# Patient Record
Sex: Male | Born: 1970 | Hispanic: Yes | Marital: Single | Smoking: Current every day smoker
Health system: Southern US, Community
[De-identification: ages and names within clinical notes are randomized; demographics above are authoritative.]

## PROBLEM LIST (undated history)

## (undated) DIAGNOSIS — I2510 Atherosclerotic heart disease of native coronary artery without angina pectoris: Secondary | ICD-10-CM

## (undated) DIAGNOSIS — I10 Essential (primary) hypertension: Secondary | ICD-10-CM

## (undated) HISTORY — PX: OTHER SURGICAL HISTORY: SHX169

## (undated) HISTORY — PX: CORONARY ANGIOPLASTY: SHX604

---

## 2006-05-24 ENCOUNTER — Emergency Department: Payer: Self-pay | Admitting: Internal Medicine

## 2008-04-09 HISTORY — PX: CORONARY ANGIOPLASTY WITH STENT PLACEMENT: SHX49

## 2008-11-29 ENCOUNTER — Ambulatory Visit: Payer: Self-pay | Admitting: Cardiovascular Disease

## 2011-03-24 ENCOUNTER — Emergency Department: Payer: Self-pay | Admitting: Emergency Medicine

## 2011-09-10 ENCOUNTER — Emergency Department: Payer: Self-pay | Admitting: Emergency Medicine

## 2011-09-10 LAB — COMPREHENSIVE METABOLIC PANEL
Albumin: 3.5 g/dL (ref 3.4–5.0)
Alkaline Phosphatase: 78 U/L (ref 50–136)
Anion Gap: 12 (ref 7–16)
BUN: 17 mg/dL (ref 7–18)
Bilirubin,Total: 0.4 mg/dL (ref 0.2–1.0)
Calcium, Total: 8.7 mg/dL (ref 8.5–10.1)
Chloride: 102 mmol/L (ref 98–107)
Co2: 26 mmol/L (ref 21–32)
Creatinine: 0.85 mg/dL (ref 0.60–1.30)
EGFR (African American): >60
EGFR (Non-African Amer.): >60
Glucose: 68 mg/dL (ref 65–99)
Osmolality: 279 (ref 275–301)
Potassium: 4 mmol/L (ref 3.5–5.1)
SGOT(AST): 35 U/L (ref 15–37)
SGPT (ALT): 42 U/L
Sodium: 140 mmol/L (ref 136–145)
Total Protein: 7.8 g/dL (ref 6.4–8.2)

## 2011-09-10 LAB — CBC
HCT: 48.4 % (ref 40.0–52.0)
HGB: 16.2 g/dL (ref 13.0–18.0)
MCH: 32.9 pg (ref 26.0–34.0)
MCHC: 33.5 g/dL (ref 32.0–36.0)
MCV: 98 fL (ref 80–100)
Platelet: 158 10*3/uL (ref 150–440)
RBC: 4.93 10*6/uL (ref 4.40–5.90)
RDW: 14.5 % (ref 11.5–14.5)
WBC: 6.5 10*3/uL (ref 3.8–10.6)

## 2012-05-20 ENCOUNTER — Emergency Department: Payer: Self-pay | Admitting: Emergency Medicine

## 2012-10-07 ENCOUNTER — Emergency Department: Payer: Self-pay | Admitting: Emergency Medicine

## 2012-10-07 LAB — CBC
HCT: 46.4 % (ref 40.0–52.0)
HGB: 15.7 g/dL (ref 13.0–18.0)
MCH: 32.2 pg (ref 26.0–34.0)
MCHC: 34 g/dL (ref 32.0–36.0)
MCV: 95 fL (ref 80–100)
Platelet: 149 10*3/uL — ABNORMAL LOW (ref 150–440)
RBC: 4.89 10*6/uL (ref 4.40–5.90)
RDW: 14.1 % (ref 11.5–14.5)
WBC: 6.9 10*3/uL (ref 3.8–10.6)

## 2012-10-07 LAB — BASIC METABOLIC PANEL
Anion Gap: 4 — ABNORMAL LOW (ref 7–16)
BUN: 14 mg/dL (ref 7–18)
Calcium, Total: 8.8 mg/dL (ref 8.5–10.1)
Chloride: 109 mmol/L — ABNORMAL HIGH (ref 98–107)
Co2: 30 mmol/L (ref 21–32)
Creatinine: 0.9 mg/dL (ref 0.60–1.30)
EGFR (African American): >60
EGFR (Non-African Amer.): >60
Glucose: 97 mg/dL (ref 65–99)
Osmolality: 285 (ref 275–301)
Potassium: 4.4 mmol/L (ref 3.5–5.1)
Sodium: 143 mmol/L (ref 136–145)

## 2012-10-07 LAB — TROPONIN I: Troponin-I: <0.02 ng/mL

## 2013-05-04 ENCOUNTER — Emergency Department: Payer: Self-pay | Admitting: Emergency Medicine

## 2013-05-04 LAB — BASIC METABOLIC PANEL
BUN: 13 mg/dL (ref 7–18)
Calcium, Total: 8.3 mg/dL — ABNORMAL LOW (ref 8.5–10.1)
Chloride: 107 mmol/L (ref 98–107)
Co2: 29 mmol/L (ref 21–32)
Creatinine: 0.81 mg/dL (ref 0.60–1.30)
EGFR (African American): >60
EGFR (Non-African Amer.): >60
Glucose: 85 mg/dL (ref 65–99)
Osmolality: 269 (ref 275–301)
Potassium: 4.7 mmol/L (ref 3.5–5.1)
Sodium: 135 mmol/L — ABNORMAL LOW (ref 136–145)

## 2013-05-04 LAB — CBC
HCT: 49.1 % (ref 40.0–52.0)
HGB: 16.2 g/dL (ref 13.0–18.0)
MCH: 31.9 pg (ref 26.0–34.0)
MCHC: 33 g/dL (ref 32.0–36.0)
MCV: 97 fL (ref 80–100)
Platelet: 159 10*3/uL (ref 150–440)
RBC: 5.09 10*6/uL (ref 4.40–5.90)
RDW: 13.9 % (ref 11.5–14.5)
WBC: 5.7 10*3/uL (ref 3.8–10.6)

## 2013-05-04 LAB — TROPONIN I: Troponin-I: <0.02 ng/mL

## 2013-07-13 ENCOUNTER — Emergency Department: Payer: Self-pay | Admitting: Emergency Medicine

## 2013-10-23 ENCOUNTER — Emergency Department: Payer: Self-pay | Admitting: Emergency Medicine

## 2013-10-23 LAB — URINALYSIS, COMPLETE
Bacteria: NONE SEEN
Bilirubin,UR: NEGATIVE
Blood: NEGATIVE
Glucose,UR: NEGATIVE mg/dL (ref 0–75)
Ketone: NEGATIVE
Leukocyte Esterase: NEGATIVE
Nitrite: NEGATIVE
Ph: 5 (ref 4.5–8.0)
Protein: NEGATIVE
RBC,UR: 1 /HPF (ref 0–5)
Specific Gravity: 1.025 (ref 1.003–1.030)
Squamous Epithelial: 5
WBC UR: 5 /HPF (ref 0–5)

## 2014-08-24 ENCOUNTER — Encounter: Payer: Self-pay | Admitting: Emergency Medicine

## 2014-08-24 ENCOUNTER — Emergency Department
Admission: EM | Admit: 2014-08-24 | Discharge: 2014-08-24 | Disposition: A | Discharge status: Home / Self Care | Payer: BLUE CROSS/BLUE SHIELD | Attending: Emergency Medicine | Admitting: Emergency Medicine

## 2014-08-24 ENCOUNTER — Other Ambulatory Visit: Payer: Self-pay

## 2014-08-24 DIAGNOSIS — R51 Headache: Secondary | ICD-10-CM | POA: Diagnosis not present

## 2014-08-24 DIAGNOSIS — G8929 Other chronic pain: Secondary | ICD-10-CM | POA: Diagnosis not present

## 2014-08-24 DIAGNOSIS — Z720 Tobacco use: Secondary | ICD-10-CM | POA: Insufficient documentation

## 2014-08-24 DIAGNOSIS — R519 Headache, unspecified: Secondary | ICD-10-CM

## 2014-08-24 DIAGNOSIS — R079 Chest pain, unspecified: Secondary | ICD-10-CM | POA: Insufficient documentation

## 2014-08-24 LAB — BASIC METABOLIC PANEL
Anion gap: 6 (ref 5–15)
BUN: 16 mg/dL (ref 6–20)
CO2: 29 mmol/L (ref 22–32)
Calcium: 8.9 mg/dL (ref 8.9–10.3)
Chloride: 102 mmol/L (ref 101–111)
Creatinine, Ser: 0.79 mg/dL (ref 0.61–1.24)
GFR calc Af Amer: >60 mL/min (ref >60)
GFR calc non Af Amer: >60 mL/min (ref >60)
Glucose, Bld: 93 mg/dL (ref 65–99)
Potassium: 5.2 mmol/L — ABNORMAL HIGH (ref 3.5–5.1)
Sodium: 137 mmol/L (ref 135–145)

## 2014-08-24 LAB — CBC
HCT: 49.7 % (ref 40.0–52.0)
Hemoglobin: 16.7 g/dL (ref 13.0–18.0)
MCH: 32.1 pg (ref 26.0–34.0)
MCHC: 33.7 g/dL (ref 32.0–36.0)
MCV: 95.3 fL (ref 80.0–100.0)
Platelets: 147 10*3/uL — ABNORMAL LOW (ref 150–440)
RBC: 5.22 MIL/uL (ref 4.40–5.90)
RDW: 14.1 % (ref 11.5–14.5)
WBC: 5.5 10*3/uL (ref 3.8–10.6)

## 2014-08-24 LAB — TROPONIN I: Troponin I: <0.03 ng/mL (ref <0.031)

## 2014-08-24 MED ORDER — IBUPROFEN 800 MG PO TABS
800.0000 mg | ORAL_TABLET | Freq: Once | ORAL | Status: AC
  Administered 2014-08-24: 800 mg via ORAL

## 2014-08-24 MED ORDER — IBUPROFEN 800 MG PO TABS
ORAL_TABLET | ORAL | Status: AC
  Administered 2014-08-24: 800 mg via ORAL
  Filled 2014-08-24: qty 1

## 2014-08-24 NOTE — Discharge Instructions (Signed)
Dolor de pecho (no especfico) (Chest Pain (Nonspecific)) Suele ser difcil diagnosticar la causa del dolor de [place]. Siempre hay una posibilidad de que el dolor podra estar relacionado con algo grave, como un ataque al corazn o un cogulo sanguneo en los pulmones. Debe concurrir a las visitas de control con el mdico. CUIDADOS EN EL HOGAR  Si le dieron antibiticos, tmelos como se lo haya indicado el mdico. Finalice el medicamento, aunque comience a Actorsentirse mejor.  [address]Durante los das siguientes, no haga actividades que provoquen dolor de [place]. Contine con las actividades fsicas como se lo haya indicado el mdico.  No use productos que contengan tabaco, que incluyen cigarrillos, tabaco para Theatre managermascar y Administrator, Civil Servicecigarrillos electrnicos.  Evite el consumo de alcohol.  Tome los medicamentos solamente como se lo haya indicado el mdico.  Siga las sugerencias del mdico en lo que respecta a ms pruebas, si el dolor de pecho no desaparece.  Concurra a todas las visitas que concert con el mdico. SOLICITE AYUDA SI:  El dolor de pecho no desaparece, incluso despus del tratamiento.  Tiene una erupcin cutnea con ampollas en el pecho.  Tiene fiebre. SOLICITE AYUDA DE INMEDIATO SI:   Aumenta el dolor de pecho o el dolor se irradia hacia el brazo, el cuello, la [place]ula, la espalda o el vientre (abdomen).  Le falta el aire.  Tose ms de lo normal o tose con sangre.  Siente un dolor muy intenso en la espalda o el vientre.  Tiene malestar estomacal (nuseas) o vomita.  Se siente muy dbil.  Pierde el conocimiento (se desmaya).  Tiene escalofros. Esto es Radio broadcast assistantuna emergencia. No espere a ver que los problemas desaparezcan. Llame a los servicios de emergencia locales (911 en los [place]). No conduzca por sus propios medios Dollar Generalhasta el hospital. ASEGRESE DE QUE:   Comprende estas instrucciones.  Controlar su afeccin.  Recibir ayuda de inmediato si no mejora o si empeora. Document  Released: 06/22/2008 Document Revised: 03/31/2013 [facility] Patient Information 2015 [place], [place]. This information is not intended to replace advice given to you by your health care provider. Make sure you discuss any questions you have with your health care provider.  Dolor de cabeza general sin causa  (General Headache Without Cause)  Un dolor de cabeza en general es un dolor o malestar que se siente en la zona de la cabeza o del cuello. Se desconocen las causas.  CUIDADOS EN EL HOGAR   Cumpla con los controles mdicos segn las indicaciones.  Tome slo los medicamentos que le haya indicado el mdico.  Cuando sienta dolor de cabeza acustese en un cuarto oscuro y tranquilo.  Lleve un registro diario para averiguar si ciertas cosas provocan dolores de cabeza. Por ejemplo, escriba:  Lo que come y bebe.  Cunto tiempo duerme.  Todo cambio en la dieta o medicamentos.  Reljese recibiendo masajes o haga otras actividades relajantes.  Coloque hielo o calor en la cabeza y el cuello como lo indique su mdico.  DISMINUYA EL NIVEL DE ESTRS  Sintase con la espalda recta. No apriete los msculos (tensione).  Si fuma, deje de hacerlo.  Beba menos alcohol.  Consuma menos cafena o deje de tomarla.  Coma y duerma en horarios regulares.  Duerma entre 7 y 9 horas o como le indique su mdico.  DietitianMantenga las luces tenues si le Liz Claibornemolesta las luces brillantes o sus dolores de cabeza empeoran. SOLICITE AYUDA DE INMEDIATO SI:   El dolor de [place] empeora.  Tiene fiebre.  Presenta  rigidez en el cuello.  Tiene dificultad para ver.  Sus msculos estn dbiles o pierde el control muscular.  Pierde equilibrio o tiene problemas para Advertising account plannercaminar.  Siente que se desvanece (debilidad) o se desmaya.  Tiene sntomas intensos que son diferentes a los primeros sntomas.  Tiene problemas con los medicamentos que le recet su mdico.  El medicamento no le hace efecto.  Siente que el dolor de  [place] es diferente a otros dolores de [place]abe[place].  Tiene malestar estomacal (nuseas) o (vmitos). ASEGRESE DE QUE:   Comprende estas instrucciones.  Controlar su enfermedad.  Solicitar ayuda de inmediato si no mejora o si empeora. Document Released: 06/18/2011 [facility] Patient Information 2015 [place]Care, [place]LL[place]. This information is not intended to replace advice given to you by your health care provider. Make sure you discuss any questions you have with your health care provider.

## 2014-08-24 NOTE — ED Provider Notes (Signed)
[facility] Provider Note  ____________________________________________  Time seen: 23945  I have reviewed the triage vital signs and the nursing notes.   HISTORY  Chief Complaint Chest Pain  headache    HPI Walter Pierce is a 44 y.o. male who reports to me that he has had a headache since yesterday and it got worse this morning. He has a history of similar headaches. His primary focus in our discussion has been the headache as compared to the nurse's note recognizing chest pain. He does report he had some chest pain that started this morning. It is mild to moderate. He has no shortness of breath. He has no nausea vomiting or diaphoresis.   Patient denies any focal weakness or loss of sensation.   History reviewed. No pertinent past medical history.  There are no active problems to display for this patient.   Past Surgical History  Procedure Laterality Date  . Coronary angioplasty      Current Outpatient Rx  Name  Route  Sig  Dispense  Refill  . ibuprofen (ADVIL,MOTRIN) 200 MG tablet   Oral   Take 200 mg by mouth every 6 (six) hours as needed for headache or mild pain.           Allergies Review of patient's allergies indicates no known allergies.  Family History  Problem Relation Age of Onset  . Diabetes Mother   . Heart attack Father     Social History History  Substance Use Topics  . Smoking status: Current Every Day Smoker  . Smokeless tobacco: Not on file  . Alcohol Use: No    Review of Systems Constitutional: Negative for fever. ENT: Negative for sore throat. Cardiovascular: Positive for chest pain today. See history of present illness. Respiratory: Negative for shortness of breath. Gastrointestinal: Negative for abdominal pain, vomiting and diarrhea. Genitourinary: Negative for dysuria. Musculoskeletal: Negative for back pain. Skin: Negative for rash. Neurological: Positive for chronic  headaches, worse today. See history of present illness.   10-point ROS otherwise negative.  ____________________________________________   PHYSICAL EXAM:  VITAL SIGNS: ED Triage Vitals  Enc Vitals Group     BP 08/24/14 0813 140/81 mmHg     Pulse Rate 08/24/14 0813 80     Resp 08/24/14 0813 18     Temp 08/24/14 0813 98.5 F (36.9 C)     Temp Source 08/24/14 0813 Oral     SpO2 08/24/14 0813 99 %     Weight 08/24/14 0813 300 lb (136.079 kg)     Height 08/24/14 0813 5\' 7"  (1.702 m)     Head Cir --      Peak Flow --      Pain Score 08/24/14 0813 8     Pain Loc --      Pain Edu? --      Excl. in GC? --     Constitutional: Alert and oriented, communicative and is speaking male.. Well appearing and in no distress. ENT   Head: Normocephalic and atraumatic.   Nose: No congestion/rhinnorhea.   Mouth/Throat: Mucous membranes are moist. Cardiovascular: Normal rate, regular rhythm. Respiratory: Normal respiratory effort without tachypnea. Breath sounds are clear and equal bilaterally. No wheezes/rales/rhonchi. Gastrointestinal: Soft and nontender. No distention.  Back: There is no CVA tenderness. Musculoskeletal: Nontender with normal range of motion in all extremities.  No noted edema. Neurologic:  Normal speech and language. No gross focal neurologic deficits are appreciated.  Skin:  Skin is warm, dry.  No rash noted. Psychiatric: Mood and affect are normal. Speech and behavior are normal.  ____________________________________________    LABS (pertinent positives/negatives)  Blood tests overall are unremarkable except for slightly high potassium at 5.2. Troponin is negative.  ____________________________________________   EKG  EKG at 8:15 AM shows normal sinus rhythm at 74 with normal intervals. He has borderline criteria for left ventricular hypertrophy.  ____________________________________________     PROCEDURES  Procedure(s) performed: None  Critical  Care performed: No  ____________________________________________   INITIAL IMPRESSION / ASSESSMENT AND PLAN / ED COURSE  Well-appearing 44 year old male complaining of a slightly worse headache than usual. He does get headaches frequently. He has asked for ibuprofen. He did not take anything last night or this morning prior to arrival. He speaks of his chest pain but it appears to be secondary in his concerns. The discomfort is minimal. His EKG looks okay and his blood tests a chloride including a negative troponin.  We treated the patient with 800 mg of ibuprofen. On reassessment at 1245 he reports he feels much better. His daughter is here and helps with aspects of the history. An interpreter was used as well to speak with the patient. He is now pain-free and comfortable, negative neurologic exam, no chest pain, and ready for discharge. We [facility].  Of note we reviewed his cardiac catheterization from 2 or 3 years ago. Is required no stenting. Overall as an unremarkable catheter according to the patient and his daughter.  ____________________________________________   FINAL CLINICAL IMPRESSION(S) / ED DIAGNOSES  Final diagnoses:  Acute nonintractable headache, unspecified headache type  Nonspecific chest pain      Darien Ramusavid W Damarion Mendizabal, MD 08/24/14 1312

## 2014-08-24 NOTE — ED Notes (Signed)
Interpreter in room with patient, daughter and MD to discuss update on status.

## 2014-08-24 NOTE — ED Notes (Signed)
Pt to ed with c/o chest pain and headache that started this am while he was at work this am.  Pt states pain continues, reports it is in left chest and does not radiate to arms or neck.  Pt also reports sob and nausea associated with pain.

## 2014-12-06 ENCOUNTER — Ambulatory Visit (INDEPENDENT_AMBULATORY_CARE_PROVIDER_SITE_OTHER): Payer: BLUE CROSS/BLUE SHIELD | Admitting: Family Medicine

## 2014-12-06 ENCOUNTER — Encounter: Payer: Self-pay | Admitting: Family Medicine

## 2014-12-06 DIAGNOSIS — R030 Elevated blood-pressure reading, without diagnosis of hypertension: Secondary | ICD-10-CM

## 2014-12-06 DIAGNOSIS — R5382 Chronic fatigue, unspecified: Secondary | ICD-10-CM

## 2014-12-06 DIAGNOSIS — E669 Obesity, unspecified: Secondary | ICD-10-CM | POA: Insufficient documentation

## 2014-12-06 DIAGNOSIS — IMO0001 Reserved for inherently not codable concepts without codable children: Secondary | ICD-10-CM

## 2014-12-06 DIAGNOSIS — Z13220 Encounter for screening for lipoid disorders: Secondary | ICD-10-CM

## 2014-12-06 DIAGNOSIS — Z720 Tobacco use: Secondary | ICD-10-CM | POA: Diagnosis not present

## 2014-12-06 DIAGNOSIS — R609 Edema, unspecified: Secondary | ICD-10-CM

## 2014-12-06 DIAGNOSIS — Z833 Family history of diabetes mellitus: Secondary | ICD-10-CM

## 2014-12-06 DIAGNOSIS — M25552 Pain in left hip: Secondary | ICD-10-CM | POA: Diagnosis not present

## 2014-12-06 DIAGNOSIS — Z113 Encounter for screening for infections with a predominantly sexual mode of transmission: Secondary | ICD-10-CM | POA: Diagnosis not present

## 2014-12-06 LAB — UA/M W/RFLX CULTURE, ROUTINE
Bilirubin, UA: NEGATIVE
Glucose, UA: NEGATIVE
Ketones, UA: NEGATIVE
Leukocytes, UA: NEGATIVE
Nitrite, UA: NEGATIVE
Protein, UA: NEGATIVE
RBC, UA: NEGATIVE
Specific Gravity, UA: 1.015 (ref 1.005–1.030)
Urobilinogen, Ur: 1 mg/dL (ref 0.2–1.0)
pH, UA: 6.5 (ref 5.0–7.5)

## 2014-12-06 LAB — CBC WITH DIFFERENTIAL/PLATELET
Hematocrit: 49.3 % (ref 37.5–51.0)
Hemoglobin: 17.4 g/dL (ref 12.6–17.7)
Lymphocytes Absolute: 2.6 10*3/uL (ref 0.7–3.1)
Lymphs: 45 %
MCH: 33.5 pg — ABNORMAL HIGH (ref 26.6–33.0)
MCHC: 35.3 g/dL (ref 31.5–35.7)
MCV: 95 fL (ref 79–97)
MID (Absolute): 0.6 10*3/uL (ref 0.1–1.6)
MID: 10 %
Neutrophils Absolute: 2.5 10*3/uL (ref 1.4–7.0)
Neutrophils: 45 %
Platelets: 170 10*3/uL (ref 150–379)
RBC: 5.2 x10E6/uL (ref 4.14–5.80)
RDW: 14.8 % (ref 12.3–15.4)
WBC: 5.7 10*3/uL (ref 3.4–10.8)

## 2014-12-06 LAB — BAYER DCA HB A1C WAIVED: HB A1C (BAYER DCA - WAIVED): 5.3 % (ref <7.0)

## 2014-12-06 LAB — LIPID PANEL PICCOLO, WAIVED
Chol/HDL Ratio Piccolo,Waive: 4.2 mg/dL
Cholesterol Piccolo, Waived: 189 mg/dL (ref <200)
HDL Chol Piccolo, Waived: 45 mg/dL — ABNORMAL LOW (ref >59)
LDL Chol Calc Piccolo Waived: 107 mg/dL — ABNORMAL HIGH (ref <100)
Triglycerides Piccolo,Waived: 180 mg/dL — ABNORMAL HIGH (ref <150)
VLDL Chol Calc Piccolo,Waive: 36 mg/dL — ABNORMAL HIGH (ref <30)

## 2014-12-06 LAB — MICROALBUMIN, URINE WAIVED
Creatinine, Urine Waived: 200 mg/dL (ref 10–300)
Microalb, Ur Waived: 10 mg/L (ref 0–19)
Microalb/Creat Ratio: <30 mg/g (ref <30)

## 2014-12-06 NOTE — Assessment & Plan Note (Signed)
Labs checked today. Discussed the importance of diet and exercise- walking a day and decreasing portion size. Will work on it. Check in for PE in 2 weeks.

## 2014-12-06 NOTE — Progress Notes (Signed)
BP 130/88 mmHg  Pulse 79  Temp(Src) 98.6 F (37 C)  Ht 5' 6.3" (1.684 m)  Wt 303 lb (137.44 kg)  BMI 48.47 kg/m2  SpO2 98%   Subjective:    Patient ID: Walter Pierce, male    DOB: [birth year], 44 y.o.   MRN: [id number]  HPI: Walter Pierce is a 44 y.o. male  Here today to establish care and translation provided by a [facility] Interpretor. He has never seen a PCP. Chief Complaint  Patient presents with  . Diabetes    strong family history  . Migraine  . Hip Pain    left hip pain, it will give out on him when trying to rise from a sitting position, also while he is walking   He is concerned about potentially having DM. Has a very strong family history with his daughter having it, his grandparents needing amputations. He notes that he is overweight, and is concerned about having DM. Will check A1c and lab work today.  ?DIABETES Hypoglycemic episodes:yes Polydipsia/polyuria: yes Visual disturbance: yes- floaters and blurring Chest pain: no Paresthesias: yes  Has been going on about a year. Hurts more now than previously. Didn't hurt it. Has been getting worse over the last year, now giving out. Now giving pain down his left leg. Gives him numbness and tingling, had swelling last week- couldn't go to work. Worse with standing and walking, OK when sitting and resting. With working- has been around a 9/10. Feels tired in the leg. No stretching. No ice or heat  Relevant past medical, surgical, family and social history reviewed and updated as indicated. Interim medical history since our last visit reviewed. Allergies and medications reviewed and updated.  Review of Systems  Constitutional: Negative.   Respiratory: Negative.   Cardiovascular: Negative.   Gastrointestinal: Negative.   Genitourinary: Negative.   Psychiatric/Behavioral: Negative.    Per HPI unless specifically indicated above     Objective:    BP 130/88 mmHg  Pulse 79  Temp(Src) 98.6 F (37 C)  Ht  5' 6.3" (1.684 m)  Wt 303 lb (137.44 kg)  BMI 48.47 kg/m2  SpO2 98%  Wt Readings from Last 3 Encounters:  12/06/14 303 lb (137.44 kg)  08/24/14 300 lb (136.079 kg)    Physical Exam  Constitutional: He is oriented to person, place, and time. He appears well-developed and well-nourished. No distress.  Morbidly obese  HENT:  Head: Normocephalic and atraumatic.  Right Ear: Hearing normal.  Left Ear: Hearing normal.  Nose: Nose normal.  Eyes: Conjunctivae and lids are normal. Right eye exhibits no discharge. Left eye exhibits no discharge. No scleral icterus.  Cardiovascular: Normal rate, regular rhythm and normal heart sounds.  Exam reveals no gallop and no friction rub.   No murmur heard. Pulmonary/Chest: Effort normal and breath sounds normal. No respiratory distress. He has no wheezes. He has no rales. He exhibits no tenderness.  Musculoskeletal:       Left hip: He exhibits decreased range of motion, decreased strength and tenderness. He exhibits no bony tenderness, no swelling, no crepitus, no deformity and no laceration.  + FABREs on the L, no tenderness to palpation. Decreased flexion and extension on the L  Neurological: He is alert and oriented to person, place, and time.  Skin: Skin is warm, dry and intact. No rash noted. No erythema. No pallor.  Psychiatric: He has a normal mood and affect. His speech is normal and behavior is normal. Judgment and thought content normal.  Cognition and memory are normal.  Nursing note and vitals reviewed.  Results for orders placed or performed in visit on 12/06/14  Bayer DCA Hb A1c Waived  Result Value Ref Range   Bayer DCA Hb A1c Waived 5.3 <7.0 %  CBC With Differential/Platelet  Result Value Ref Range   WBC 5.7 3.4 - 10.8 x10E3/uL   RBC 5.20 4.14 - 5.80 x10E6/uL   Hemoglobin 17.4 12.6 - 17.7 g/dL   Hematocrit 16.1 09.6 - 51.0 %   MCV 95 79 - 97 fL   MCH 33.5 (H) 26.6 - 33.0 pg   MCHC 35.3 31.5 - 35.7 g/dL   RDW 04.5 40.9 - 81.1 %    Platelets 170 150 - 379 x10E3/uL   Neutrophils 45 %   Lymphs 45 %   MID 10 %   Neutrophils Absolute 2.5 1.4 - 7.0 x10E3/uL   Lymphocytes Absolute 2.6 0.7 - 3.1 x10E3/uL   MID (Absolute) 0.6 0.1 - 1.6 X10E3/uL  Lipid Panel Piccolo, Waived  Result Value Ref Range   Cholesterol Piccolo, Waived 189 <200 mg/dL   HDL Chol Piccolo, Waived 45 (L) >59 mg/dL   Triglycerides Piccolo,Waived 180 (H) <150 mg/dL   Chol/HDL Ratio Piccolo,Waive 4.2 mg/dL   LDL Chol Calc Piccolo Waived 107 (H) <100 mg/dL   VLDL Chol Calc Piccolo,Waive 36 (H) <30 mg/dL  Microalbumin, Urine Waived  Result Value Ref Range   Microalb, Ur Waived 10 0 - 19 mg/L   Creatinine, Urine Waived 200 10 - 300 mg/dL   Microalb/Creat Ratio <30 <30 mg/g  UA/M w/rflx Culture, Routine  Result Value Ref Range   Specific Gravity, UA 1.015 1.005 - 1.030   pH, UA 6.5 5.0 - 7.5   Color, UA Yellow Yellow   Appearance Ur Clear Clear   Leukocytes, UA Negative Negative   Protein, UA Negative Negative/Trace   Glucose, UA Negative Negative   Ketones, UA Negative Negative   RBC, UA Negative Negative   Bilirubin, UA Negative Negative   Urobilinogen, Ur 1.0 0.2 - 1.0 mg/dL   Nitrite, UA Negative Negative      Assessment & Plan:   Problem List Items Addressed This Visit      Other   Left hip pain    Seems to be within the joint itself. Will check x-ray to look for potential cause such as arthritis/labal issue. Would benefit from weight loss and possibly PT following x-ray. Ordered today. Await results.       Relevant Orders   DG Pelvis Comp Min 3V   Morbid obesity - Primary    Labs checked today. Discussed the importance of diet and exercise- walking a day and decreasing portion size. Will work on it. Check in for PE in 2 weeks.       Relevant Orders   Bayer DCA Hb A1c Waived (Completed)   Comprehensive metabolic panel   Microalbumin, Urine Waived (Completed)   TSH   Family history of diabetes mellitus (DM)    Symptoms  concerning. A1c checked today and was normal. Discussed the importance of weight loss and exercise. Information given to patient today. Work on diet and exercise.       Relevant Orders   Bayer DCA Hb A1c Waived (Completed)   Comprehensive metabolic panel   Microalbumin, Urine Waived (Completed)   TSH    Other Visit Diagnoses    Routine screening for STI (sexually transmitted infection)        Would like to be screened.  Labs drawn today.     Relevant Orders    HIV antibody    Hepatitis, Acute    GC/Chlamydia Probe Amp    RPR    HSV(herpes simplex vrs) 1+2 ab-IgG    Screening cholesterol level        Levels look good. Continue to monitor. Work on diet and exercise.     Relevant Orders    Lipid Panel Piccolo, Waived (Completed)    Chronic fatigue        Checking labs today. Await results. Likely due to weight.     Relevant Orders    CBC With Differential/Platelet (Completed)    TSH    Tobacco abuse        Encouraged stopping smoking.     Relevant Orders    CBC With Differential/Platelet (Completed)    UA/M w/rflx Culture, Routine (Completed)    Elevated BP        Better on recheck. Continue to monitor. Negative microalbumin today. Work on diet and exercise.      Relevant Orders    Microalbumin, Urine Waived (Completed)    Edema        Compression stockings sent to his pharmacy. 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     Relevant Orders    Compression stockings        Follow up plan: Return in about 2 weeks (around 12/20/2014), or 2-3 weeks PE.

## 2014-12-06 NOTE — Assessment & Plan Note (Signed)
Symptoms concerning. A1c checked today and was normal. Discussed the importance of weight loss and exercise. Information given to patient today. Work on diet and exercise.

## 2014-12-06 NOTE — Patient Instructions (Signed)
Ejercicios para perder peso (Exercise to Lose Weight) La actividad fsica y [place]omia Dear dieta saludable ayudan a perder peso. El mdico podr sugerirle ejercicios especficos. IDEAS Y CONSEJOS PARA HACER EJERCICIOS  Elija opciones econmicas que disfrute hacer , como caminar, andar en bicicleta o los vdeos para ejercitarse.  Utilice las Microbiologist del ascensor.  Camine durante la hora del almuerzo.  Estacione el auto lejos del lugar de [place] o [place].  Concurra a un gimnasio o tome clases de gimnasia.  Comience con 5  10 minutos de actividad fsica por da. Ejercite hasta 30 minutos, 4 a 6 das por [address]  Utilice zapatos que tengan un buen soporte y ropas cmodas.  Elongue antes y despus de Company secretary.  Ejercite hasta que aumente la respiracin y el corazn palpite rpido.  Beba agua extra cuando ejercite.  No haga ejercicio Firefighter, sentirse mareado o que le falte mucho el aire. La actividad fsica puede quemar alrededor de 150 caloras.  Correr 20 cuadras en 15 minutos.  Jugar vley durante 45 a 60 minutos.  Limpiar y encerar el auto durante 45 a 60 minutos.  Jugar ftbol americano de toque.  Caminar 25 cuadras en 35 minutos.  Empujar un cochecito 20 cuadras en 30 minutos.  Jugar baloncesto durante 30 minutos.  Rastrillar hojas secas durante 30 minutos.  Andar en bicicleta 80 cuadras en 30 minutos.  Caminar 30 cuadras en 30 minutos.  Bailar durante 30 minutos.  Quitar la nieve con una pala durante 15 minutos.  Nadar vigorosamente durante 20 minutos.  Subir escaleras durante 15 minutos.  Andar en bicicleta 60 cuadras durante 15 minutos.  Arreglar el jardn entre 30 y 45 minutos.  Saltar a la soga durante 15 minutos.  Limpiar vidrios o pisos durante 45 a 60 minutos. Document Released: 06/30/2010 Document Revised: 06/18/2011 [facility] Patient Information 2015 [place], [place]. This information is not intended to replace advice given to you  by your health care provider. Make sure you discuss any questions you have with your health care provider. Dieta para cuidar Insurance underwriter  (Cardiac Diet)  La dieta para cuidar el corazn evita las enfermedades cardacas o el ictus. Consiste en comer menos grasas no saludables y comer ms grasas saludables.  ALIMENTOS O BEBIDAS QUE DEBE EVITAR O LIMITAR   Limite el consumo de grasas saturadas. Este tipo de grasas se Occupational psychologist en los aceites y productos lcteos como:  Aceite de coco  Aceite de palma.  Manteca de cacao.  Manteca.  Evite las grasas trans o los aceites hidrogenados. Estas grasas se encuentran en los alimentos fritos o prehorneados como:  Occupational hygienist, tortas o galletas precocidas  Limite las carnes procesadas (hot dogs, fiambres, salchichas) a 3 onzas 885 gr.) por semana.  Limite las carnes con alto contenido de grasa (carnes veteadas, pollo frito o pollo con piel) a 3 onzas (85 gr.) por semana.  Limite la sal (sodio) a 1500 miligramos al da.  Limite los dulces y las bebidas con azcar agregada a no ms de 5 porciones por semana. Una porcin es:  1 cucharada de azcar.  1 cucharada de [place].   taza de sorbete.  1 taza de limonada.   taza de gaseosa comn. CONSUMA MS DE LOS SIGUIENTES ALIMENTOS  Frutas   Consuma 4 a 5 porciones al da. Una porcin de fruta equivale a:  1 fruta mediana entera.   taza de fruta seca.   taza de fruta fresca, congelada o enlatada.   taza  de fruta 100% jugo. Vegetales:   Consuma 4 o 5 porciones por Futures trader. Una porcin es:  1 taza de vegetales de hoja crudos.   taza de vegetales cortados, crudos o cocidos.   taza de jugo de naranjas Granos enteros   Consuma 3 porciones por da (1 onza [28 gr.] equivale a 1 porcin). Legumbres ( frijoles, arvejas y lentejas).   Consuma al menos 4 porciones por semana ( taza equivale a 1 porcin). Frutos secos y semillas   Consuma al menos 4 porciones a  la semana ( de taza equivale a 1 porcin). Fibra dietaria   Consuma 20 a 30 gramos por da. Algunos alimentos ricos en fibra son:  Hilda Lias secos.  Frutas ctricas.  Manzanas, bananas.  Brcoli, repollitos de [place]us[place] y berenjenas.  Avena. Grasas Omega-3   Consuma alimentos con grasas omega-3. Tambin puede tomar un comprimido dietario (suplemento) que contenga 1 gramo de DHA y [employer]. Consuma 3,5 onzas ( 100 gr.) de pescado graso por la semana, como:  Salmn.  Caballa.  Atn blanco.  Sardinas  Trucha de lago.  Arenque PREPARE SUS ALIMENTOS USTED MISMO   Cocine los alimentos hervidos, horneados, al vapor o asados. No cocine los alimentos en fritura. No cocine los alimentos en manteca (grasa).  Utilice un spray antiadherente para cocinar.  Quite la piel de las aves, como el pollo y [place].  Quite la grasa de la carne.  Retire la grasa de la superficie cuando cocine guisos, sopas y salsas.  Use limn o hierbas para condimentar comidas en lugar de usar mantequilla o margarina.  Use yogur descremados, salsas o aderezps para ensaladas con bajo contenido de [place]. Document Released: 09/25/2011 [facility] Patient Information 2015 [place], [place]. This information is not intended to replace advice given to you by your health care provider. Make sure you discuss any questions you have with your health care provider. Dolor de cadera (Hip Pain) La cadera es la articulacin entre la parte superior de las piernas y la parte inferior de la pelvis. Los [employer], Research scientist (physical sciences), los tendones y los msculos de la articulacin de la cadera trabajan arduamente cada da para sostener el peso del cuerpo y Nurse, children's. El Engineer, mining de cadera puede tener distintos grados, desde un dolor leve hasta un dolor intenso en uno o ambos lados de la cadera. El dolor puede sentirse en la parte interna de la articulacin de la cadera, cerca de la ingle, o en la parte externa, cerca de los glteos  y la parte superior de los muslos. Tambin puede estar acompaado de hinchazn o entumecimiento.  INSTRUCCIONES PARA EL CUIDADO EN EL HOGAR   Tome los medicamentos solamente como se lo haya indicado el mdico.  Aplique hielo sobre la zona lesionada.  Ponga el hielo en una bolsa plstica.  Coloque una toalla entre la piel y la bolsa de hielo.  Aplique el hielo de 3 a 4 veces por da, durante 15 a en cada aplicacin.  Mantenga la pierna levantada (elevada) siempre que sea posible, para reducir la hinchazn.  Evite las actividades que Teaching laboratory technician.  Siga los ejercicios especficos segn las indicaciones del mdico.  Duerma con una almohada entre las piernas del lado que le sea ms cmodo.  Anote la frecuencia con la que tiene dolor en la cadera, la ubicacin del dolor y lo que siente. SOLICITE ATENCIN MDICA SI:   No puede apoyar el peso del cuerpo [employer].  La cadera est enrojecida o hinchada, o  muy dolorida con la palpacin.  El dolor o la hinchazn continan o empeoran despus de 1semana.  Tiene una creciente dificultad para caminar.  Tiene fiebre. SOLICITE ATENCIN MDICA DE INMEDIATO SI:   Se ha cado.  El dolor y la hinchazn de la cadera aumentan de repente. ASEGRESE DE QUE:   Comprende estas instrucciones.  Controlar su afeccin.  Recibir ayuda de inmediato si no mejora o si empeora. Document Released: 08/10/2013 [facility] Patient Information 2015 [place], [place]. This information is not intended to replace advice given to you by your health care provider. Make sure you discuss any questions you have with your health care provider.

## 2014-12-06 NOTE — Assessment & Plan Note (Signed)
Seems to be within the joint itself. Will check x-ray to look for potential cause such as arthritis/labal issue. Would benefit from weight loss and possibly PT following x-ray. Ordered today. Await results.

## 2014-12-07 LAB — COMPREHENSIVE METABOLIC PANEL
ALT: 30 IU/L (ref 0–44)
AST: 23 IU/L (ref 0–40)
Albumin/Globulin Ratio: 1.3 (ref 1.1–2.5)
Albumin: 3.9 g/dL (ref 3.5–5.5)
Alkaline Phosphatase: 86 IU/L (ref 39–117)
BUN/Creatinine Ratio: 19 (ref 9–20)
BUN: 16 mg/dL (ref 6–24)
Bilirubin Total: 0.6 mg/dL (ref 0.0–1.2)
CO2: 25 mmol/L (ref 18–29)
Calcium: 9.3 mg/dL (ref 8.7–10.2)
Chloride: 100 mmol/L (ref 97–108)
Creatinine, Ser: 0.83 mg/dL (ref 0.76–1.27)
GFR calc Af Amer: 125 mL/min/{1.73_m2} (ref >59)
GFR calc non Af Amer: 108 mL/min/{1.73_m2} (ref >59)
Globulin, Total: 3 g/dL (ref 1.5–4.5)
Glucose: 76 mg/dL (ref 65–99)
Potassium: 4.9 mmol/L (ref 3.5–5.2)
Sodium: 141 mmol/L (ref 134–144)
Total Protein: 6.9 g/dL (ref 6.0–8.5)

## 2014-12-07 LAB — HEPATITIS PANEL, ACUTE
Hep A IgM: NEGATIVE
Hep B C IgM: NEGATIVE
Hep C Virus Ab: <0.1 s/co ratio (ref 0.0–0.9)
Hepatitis B Surface Ag: NEGATIVE

## 2014-12-07 LAB — TSH: TSH: 1.13 u[IU]/mL (ref 0.450–4.500)

## 2014-12-07 LAB — RPR: RPR Ser Ql: NONREACTIVE

## 2014-12-07 LAB — HSV(HERPES SIMPLEX VRS) I + II AB-IGG
HSV 1 Glycoprotein G Ab, IgG: 47 index — ABNORMAL HIGH (ref 0.00–0.90)
HSV 2 Glycoprotein G Ab, IgG: 2.11 index — ABNORMAL HIGH (ref 0.00–0.90)

## 2014-12-08 LAB — GC/CHLAMYDIA PROBE AMP
Chlamydia trachomatis, NAA: NEGATIVE
Neisseria gonorrhoeae by PCR: NEGATIVE

## 2014-12-09 ENCOUNTER — Ambulatory Visit (INDEPENDENT_AMBULATORY_CARE_PROVIDER_SITE_OTHER): Payer: BLUE CROSS/BLUE SHIELD | Admitting: Family Medicine

## 2014-12-09 ENCOUNTER — Ambulatory Visit: Payer: BLUE CROSS/BLUE SHIELD | Admitting: Family Medicine

## 2014-12-09 ENCOUNTER — Encounter: Payer: Self-pay | Admitting: Family Medicine

## 2014-12-09 VITALS — BP 131/78 | HR 76 | Temp 97.9°F | Ht 66.3 in | Wt 304.4 lb

## 2014-12-09 DIAGNOSIS — Z21 Asymptomatic human immunodeficiency virus [HIV] infection status: Secondary | ICD-10-CM | POA: Diagnosis not present

## 2014-12-09 LAB — RNA QUALITATIVE: HIV 1 RNA Qualitative: NEGATIVE

## 2014-12-09 LAB — HIV 1/2 AB DIFFERENTIATION
HIV 1 Ab: NEGATIVE
HIV 2 Ab: NEGATIVE

## 2014-12-09 LAB — HIV ANTIBODY (ROUTINE TESTING W REFLEX)

## 2014-12-09 NOTE — Assessment & Plan Note (Signed)
Discussed with patient today. Very likely false positive. No sign of virus in his body. Will check for HIV2 RNA today- lab drawn. Will await results. Will recheck HIV in 1 month, if still positive will consider referral to ID, but still likely false positive. Discussed using condoms and having partners checked. Information given to patient today.

## 2014-12-09 NOTE — Progress Notes (Signed)
BP 131/78 mmHg  Pulse 76  Temp(Src) 97.9 F (36.6 C)  Ht 5' 6.3" (1.684 m)  Wt 304 lb 6.4 oz (138.075 kg)  BMI 48.69 kg/m2   Subjective:    Patient ID: Walter Pierce, male    DOB: Mar 16, 1971, 44 y.o.   MRN: [id number]  HPI: Walter Pierce is a 44 y.o. male who presents today with interpretor for discussion.   Chief Complaint  Patient presents with  . Results   Patient presents today to go over lab work which included a reactive 4th generation HIV with negative Abs and negative HIV1 RNA. None of his previous partners have ever tested positive. He has been screened before and was negative. Has never used drugs. His tattoos were done in a reputable shop. He has never had a blood transfusion. He has sex with women, and is currently not sexually active. He is feeling well at this moment. No other concerns.   Relevant past medical, surgical, family and social history reviewed and updated as indicated. Interim medical history since our last visit reviewed. Allergies and medications reviewed and updated.  Review of Systems  Respiratory: Negative.   Cardiovascular: Negative.   Psychiatric/Behavioral: Negative.     Per HPI unless specifically indicated above     Objective:    BP 131/78 mmHg  Pulse 76  Temp(Src) 97.9 F (36.6 C)  Ht 5' 6.3" (1.684 m)  Wt 304 lb 6.4 oz (138.075 kg)  BMI 48.69 kg/m2  Wt Readings from Last 3 Encounters:  12/09/14 304 lb 6.4 oz (138.075 kg)  12/06/14 303 lb (137.44 kg)  08/24/14 300 lb (136.079 kg)    Physical Exam  Constitutional: He is oriented to person, place, and time. He appears well-developed and well-nourished. No distress.  HENT:  Head: Normocephalic and atraumatic.  Right Ear: Hearing normal.  Left Ear: Hearing normal.  Nose: Nose normal.  Eyes: Conjunctivae and lids are normal. Right eye exhibits no discharge. Left eye exhibits no discharge. No scleral icterus.  Pulmonary/Chest: Effort normal. No respiratory  distress.  Musculoskeletal: Normal range of motion.  Neurological: He is alert and oriented to person, place, and time.  Skin: Skin is warm, dry and intact. No rash noted. No erythema. No pallor.  Psychiatric: He has a normal mood and affect. His speech is normal and behavior is normal. Judgment and thought content normal. Cognition and memory are normal.  Nursing note and vitals reviewed.   Results for orders placed or performed in visit on 12/06/14  GC/Chlamydia Probe Amp  Result Value Ref Range   Chlamydia trachomatis, NAA Negative Negative   Neisseria gonorrhoeae by PCR Negative Negative  Bayer DCA Hb A1c Waived  Result Value Ref Range   Bayer DCA Hb A1c Waived 5.3 <7.0 %  CBC With Differential/Platelet  Result Value Ref Range   WBC 5.7 3.4 - 10.8 x10E3/uL   RBC 5.20 4.14 - 5.80 x10E6/uL   Hemoglobin 17.4 12.6 - 17.7 g/dL   Hematocrit 40.9 81.1 - 51.0 %   MCV 95 79 - 97 fL   MCH 33.5 (H) 26.6 - 33.0 pg   MCHC 35.3 31.5 - 35.7 g/dL   RDW 91.4 78.2 - 95.6 %   Platelets 170 150 - 379 x10E3/uL   Neutrophils 45 %   Lymphs 45 %   MID 10 %   Neutrophils Absolute 2.5 1.4 - 7.0 x10E3/uL   Lymphocytes Absolute 2.6 0.7 - 3.1 x10E3/uL   MID (Absolute) 0.6 0.1 - 1.6 X10E3/uL  Comprehensive metabolic panel  Result Value Ref Range   Glucose 76 65 - 99 mg/dL   BUN 16 6 - 24 mg/dL   Creatinine, Ser 1.61 0.76 - 1.27 mg/dL   GFR calc non Af Amer 108 >59 mL/min/1.73   GFR calc Af Amer 125 >59 mL/min/1.73   BUN/Creatinine Ratio 19 9 - 20   Sodium 141 134 - 144 mmol/L   Potassium 4.9 3.5 - 5.2 mmol/L   Chloride 100 97 - 108 mmol/L   CO2 25 18 - 29 mmol/L   Calcium 9.3 8.7 - 10.2 mg/dL   Total Protein 6.9 6.0 - 8.5 g/dL   Albumin 3.9 3.5 - 5.5 g/dL   Globulin, Total 3.0 1.5 - 4.5 g/dL   Albumin/Globulin Ratio 1.3 1.1 - 2.5   Bilirubin Total 0.6 0.0 - 1.2 mg/dL   Alkaline Phosphatase 86 39 - 117 IU/L   AST 23 0 - 40 IU/L   ALT 30 0 - 44 IU/L  Lipid Panel Piccolo, Waived  Result  Value Ref Range   Cholesterol Piccolo, Waived 189 <200 mg/dL   HDL Chol Piccolo, Waived 45 (L) >59 mg/dL   Triglycerides Piccolo,Waived 180 (H) <150 mg/dL   Chol/HDL Ratio Piccolo,Waive 4.2 mg/dL   LDL Chol Calc Piccolo Waived 107 (H) <100 mg/dL   VLDL Chol Calc Piccolo,Waive 36 (H) <30 mg/dL  Microalbumin, Urine Waived  Result Value Ref Range   Microalb, Ur Waived 10 0 - 19 mg/L   Creatinine, Urine Waived 200 10 - 300 mg/dL   Microalb/Creat Ratio <30 <30 mg/g  TSH  Result Value Ref Range   TSH 1.130 0.450 - 4.500 uIU/mL  UA/M w/rflx Culture, Routine  Result Value Ref Range   Specific Gravity, UA 1.015 1.005 - 1.030   pH, UA 6.5 5.0 - 7.5   Color, UA Yellow Yellow   Appearance Ur Clear Clear   Leukocytes, UA Negative Negative   Protein, UA Negative Negative/Trace   Glucose, UA Negative Negative   Ketones, UA Negative Negative   RBC, UA Negative Negative   Bilirubin, UA Negative Negative   Urobilinogen, Ur 1.0 0.2 - 1.0 mg/dL   Nitrite, UA Negative Negative  HIV antibody  Result Value Ref Range   HIV Screen 4th Generation wRfx Comment Non Reactive  Hepatitis, Acute  Result Value Ref Range   Hep A IgM Negative Negative   Hepatitis B Surface Ag Negative Negative   Hep B C IgM Negative Negative   Hep C Virus Ab <0.1 0.0 - 0.9 s/co ratio  RPR  Result Value Ref Range   RPR Ser Ql Non Reactive Non Reactive  HSV(herpes simplex vrs) 1+2 ab-IgG  Result Value Ref Range   HSV 1 Glycoprotein G Ab, IgG 47.00 (H) 0.00 - 0.90 index   HSV 2 Glycoprotein G Ab, IgG 2.11 (H) 0.00 - 0.90 index  HIV 1/2 Ab Differentiation  Result Value Ref Range   HIV 1 AB Negative Negative   HIV 2 AB Negative Negative   Interpretation: Comment   RNA Qualitative  Result Value Ref Range   HIV 1 RNA Qualitative Negative Negative   Final Interpretation Comment       Assessment & Plan:   Problem List Items Addressed This Visit      Other   HIV test positive - Primary    Discussed with patient  today. Very likely false positive. No sign of virus in his body. Will check for HIV2 RNA today- lab drawn. Will await results.  Will recheck HIV in 1 month, if still positive will consider referral to ID, but still likely false positive. Discussed using condoms and having partners checked. Information given to patient today.           Follow up plan: Return for As scheduled and 1 month for lab visit.

## 2014-12-09 NOTE — Patient Instructions (Signed)
HIV Serology A blood test for HIV is used to tell you if you have the virus that causes AIDS. This is the human immunodeficiency causing virus responsible for AIDS. After exposure to the HIV virus it can take up to a year to test positive for the virus. The test becomes positive after the exposed persons body begins producing antibodies to the virus. You can still pass the virus to others during this time even if you have a negative test for HIV, and should therefore take all necessary precautions to prevent spreading the virus to others.  TESTING FOR AIDS An ELISA (enzyme-linked immunoabsorbent assay) is a blood test available to let you know if you have contracted the virus which produces AIDS. If this test is positive, it is usually repeated. If positive a second time, a second test known as the Western blot test is performed. If the Western blot test is positive, it means the person has been infected with HIV. A positive test does not mean you have developed AIDS but it does mean you can spread HIV to other people. Use protection if you are sexually active. Inform your sexual partner that you are carrying the AIDS producing virus. A negative test means you are not producing antibodies to HIV at this time. You may still be capable of transmitting HIV and should be retested if you have been exposed to the HIV virus. PREVENTION OF AIDS  The best way to prevent AIDS is to avoid high risk behavior.  The success in treating AIDS is good. Millions of research dollars are being spent on creating a vaccine to prevent the disease as well as providing a cure. New drugs have been helpful at fighting the disease.  Your caregiver will inform you of the most effective and current treatments. Call for your results as instructed by your caregiver. Remember it is your responsibility to obtain the results of your blood test. Do not assume everything is fine if you have not heard from your caregiver. Remember, a  negative test following exposure does not mean you have not acquired the virus. Follow up with more testing is necessary! Early diagnosis (learning what is wrong) leads to earlier treatment and improved outcomes. Document Released: 07/02/2000 Document Revised: 06/18/2011 Document Reviewed: 03/26/2005 [employer] Patient Information 2015 [employer]. This information is not intended to replace advice given to you by your health care provider. Make sure you discuss any questions you have with your health care provider.  

## 2014-12-14 ENCOUNTER — Telehealth: Payer: Self-pay

## 2014-12-14 NOTE — Telephone Encounter (Signed)
Walter Pierce from [facility] called concerning the HIV test that was sent over on this patient, she wanted to know if there was a page that she didn't  receive . I checked, she had everything that we have. Dr.Johnson did repeat testing on patient, she would like for [place] to send those results to her so that she can decide what else needs to be done for this patient.

## 2014-12-23 ENCOUNTER — Ambulatory Visit (INDEPENDENT_AMBULATORY_CARE_PROVIDER_SITE_OTHER): Payer: BLUE CROSS/BLUE SHIELD | Admitting: Family Medicine

## 2014-12-23 ENCOUNTER — Encounter: Payer: Self-pay | Admitting: Family Medicine

## 2014-12-23 VITALS — BP 139/86 | HR 72 | Temp 97.8°F | Ht 64.6 in | Wt 298.0 lb

## 2014-12-23 DIAGNOSIS — G43009 Migraine without aura, not intractable, without status migrainosus: Secondary | ICD-10-CM

## 2014-12-23 DIAGNOSIS — Z21 Asymptomatic human immunodeficiency virus [HIV] infection status: Secondary | ICD-10-CM | POA: Diagnosis not present

## 2014-12-23 DIAGNOSIS — Z23 Encounter for immunization: Secondary | ICD-10-CM | POA: Diagnosis not present

## 2014-12-23 MED ORDER — SUMATRIPTAN SUCCINATE 100 MG PO TABS: ORAL_TABLET | ORAL | Status: DC

## 2014-12-23 MED ORDER — PROPRANOLOL HCL ER 80 MG PO CP24: ORAL_CAPSULE | ORAL | Status: DC

## 2014-12-23 NOTE — Assessment & Plan Note (Signed)
Discussed that we will treat this like a migraine, but it likely has a rebound component. Will stop excedrine, but will continue caffeine for now and will increase water significantly. Will start propranolol and imitrex for break through. No need for MRI at this time with normal neuro exam and no aura. If not improving, consider MRI. Continue to monitor. Follow up 1 month.

## 2014-12-23 NOTE — Patient Instructions (Signed)
Cefalea migraosa recurrente (Recurrent Migraine Headache) Una cefalea migraosa es un dolor intenso y punzante en uno o ambos lados de la cabeza. Las migraas recurrentes aparecen Neomia Dearuna y Laverda Pageotra vez. La migraa puede durar desde 30 minutos hasta varias horas. CAUSAS  No siempre se conoce la causa exacta de la cefalea migraosa. Sin embargo, IT consultantpueden aparecer Circuit Citycuando los nervios del cerebro se irritan y liberan ciertas sustancias qumicas que causan inflamacin. Esto ocasiona dolor. Existen tambin ciertos factores que pueden desencadenar las migraas, como los siguientes:   Alcohol.  Fumar.  Estrs.  La menstruacin  Quesos aejados.  Los alimentos o las bebidas que contienen nitratos, glutamato, aspartamo o tiramina.  Falta de sueo.  Chocolate.  Cafena.  Hambre.  Actividad fsica extenuante.  Fatiga.  Medicamentos que se usan para tratar Aeronautical engineerel dolor en el pecho (nitroglicerina), pldoras anticonceptivas, estrgeno y algunos medicamentos para la hipertensin arterial. SNTOMAS   Dolor en uno o ambos lados de la cabeza.  Dolor pulsante o punzante.  Dolor intenso que impide Ameren Corporationrealizar las actividades diarias.  Dolor que se agrava por cualquier actividad fsica.  Nuseas, vmitos o ambos.  Mareos.  Dolor con la exposicin a las luces brillantes, a los ruidos fuertes o la Hammondactividad.  Sensibilidad general a las luces brillantes, a los ruidos fuertes o a los Limited Brandsolores. Antes de sufrir una migraa, puede recibir seales de advertencia (aura). Un aura puede incluir:  Ver las luces intermitentes.  Ver puntos brillantes, halos o lneas en zigzag.  Tener una visin en tnel o visin borrosa.  Sensacin de entumecimiento u hormigueo.  Dificultad para hablar  Debilidad muscular. DIAGNSTICO  La cefalea migraosa se diagnostica basndose en:  Sntomas.  Examen fsico.  Neomia DearUna TC (tomografa computada) o resonancia magntica de la cabeza. Estos estudios de diagnstico por  imgenes no pueden diagnosticar las migraas pero pueden ayudar a Sales promotion account executivedescartar otras causas de cefalea. TRATAMIENTO  Le prescribirn medicamentos para Engineer, materialsaliviar el dolor y las nuseas. Tambin podrn administrarse medicamentos para ayudar a Armed forces training and education officerprevenir las migraas recurrentes. INSTRUCCIONES PARA EL CUIDADO EN EL HOGAR  Slo tome medicamentos de venta libre o recetados para Primary school teachercalmar el dolor o Environmental health practitionerel malestar, segn las indicaciones de su mdico. No se recomienda usar los opiceos a Air cabin crewlargo plazo.  Cuando tenga la migraa, acustese en un cuarto oscuro y tranquilo  Lleve un registro diario para Financial risk analystaveriguar lo que puede provocar las Soil scientistcefaleas migraosas. Por ejemplo, escriba:  Lo que usted come y bebe.  Cunto tiempo duerme.  Algn cambio en su dieta o en los medicamentos.  Limite el consumo de bebidas alcohlicas.  Si fuma, deje de hacerlo.  Duerma entre 7 y 9horas, o segn las recomendaciones del mdico.  Limite el estrs.  Mantenga las luces tenues si le Goodrich Corporationmolestan las luces brillantes y la St. Georgemigraa empeora. SOLICITE ATENCIN MDICA SI:   No obtiene alivio con los Cardinal Healthmedicamentos que le han indicado.  Tiene recurrencia del dolor.  Tiene fiebre. SOLICITE ATENCIN MDICA DE INMEDIATO SI:  La migraa se hace cada vez ms intensa.  Presenta rigidez en el cuello.  Sufre prdida de la visin.  Presenta debilidad muscular o prdida del control muscular.  Comienza a perder el equilibrio o tiene problemas para caminar.  Sufre mareos o se desmaya.  Tiene sntomas graves que son diferentes a los primeros sntomas. ASEGRESE DE QUE:   Comprende estas instrucciones.  Controlar su afeccin.  Recibir ayuda de inmediato si no mejora o si empeora. Document Released: 03/26/2005 Document Revised: 03/31/2013 [facility] Patient Information 2015 [employer],  LL[employer]. This information is not intended to replace advice given to you by your health care provider. Make sure you discuss any questions you have with  your health care provider. Cefaleas de rebote por consumo excesivo de analgsicos (Analgesic Rebound Headaches) Una cefalea de rebote por consumo excesivo de analgsicos es aquella que regresa despus de que desaparece el efecto de un medicamento para Chief Technology Officer (analgsico) que se tom para tratar Chief Technology Officer de cabeza inicial. Las personas que sufren tensin, migraas o cefaleas en brote corren riesgo de tener cefaleas de rebote. Cualquier tipo de dolor de cabeza primario puede regresar como una cefalea de rebote si se toman analgsicos regularmente ms de tres veces por semana. Si el ciclo de las cefaleas de rebote [place], estas se convierten en dolores de cabeza diarios crnicos.  CAUSAS Los analgsicos que frecuentemente se asocian con este problema incluyen aquellos comunes de venta [place], como aspirina, ibuprofeno, paracetamol, antisinusticos y otros frmacos que contienen cafena. Los analgsicos narcticos tambin son Neomia Dear causa frecuente de las cefaleas de rebote.  SIGNOS Y SNTOMAS Los sntomas de las cefaleas de rebote son los mismos que los del Production designer, theatre/television/film de [place]. Los sntomas de tipos especficos de cefaleas incluyen lo siguiente: Cefalea tensional.  Presin alrededor de la cabeza.  Dolor "sordo" en la cabeza.  Dolor que siente sobre la frente y los lados de la cabeza.  Sensibilidad en los msculos de la cabeza, el cuello y los hombros. Cefalea migraosa  Dolor pulstil e intermitente en uno o ambos lados de la cabeza.  Dolor intenso que dificulta las actividades cotidianas.  Dolor que empeora con la actividad fsica.  Nuseas, vmitos o ambos.  Dolor ante la exposicin a luces brillantes, ruidos fuertes o aromas intensos.  Sensibilidad general a las luces brillantes, a los ruidos fuertes o a los aromas intensos.  Cambios visuales.  Adormecimiento de uno o [employer]oco[employer]illips. Cefaleas en brotes  Dolor intenso que comienza en un ojo, alrededor de un ojo o en la  sien.  Enrojecimiento del ojo del mismo lado del dolor.  Prpados cados o hinchados.  Dolor en un lado de la cabeza.  Nuseas.  Secrecin nasal.  Piel del rostro sudorosa y plida.  Agitacin. DIAGNSTICO  Las cefaleas de rebote por consumo excesivo de analgsicos se diagnostican mediante la revisin de los antecedentes mdicos que incluye la naturaleza de las cefaleas iniciales as como el tipo de analgsicos que ha estado tomando para tratarlas y la frecuencia con que los toma. TRATAMIENTO Normalmente, la interrupcin del consumo frecuente del analgsico reducir la frecuencia de los episodios de rebote. Al principio, esto puede [employer] cefaleas, pero, con el tiempo, el dolor debe volverse ms controlable, menos frecuente y [employer] Inc.  Consultar a un especialista en cefaleas puede ser de ayuda. Tal vez este profesional pueda ayudarlo a Chief Operating Officer las cefaleas y comprobar que estas no tengan otra causa. Los mtodos alternativos de alivio del estrs, como la acupuntura, el asesoramiento, la biorregulacin y los masajes tambin pueden ayudar. Hable con el mdico respecto de los tratamientos alternativos que podran ser adecuados para su caso. INSTRUCCIONES PARA EL CUIDADO EN EL HOGAR Suspender el consumo habitual de analgsicos puede ser difcil. Siga cuidadosamente las indicaciones del mdico. Concurra a todas las citas. Evite los factores desencadenantes que se sabe le causan las cefaleas primarias. SOLICITE ATENCIN MDICA SI: Sigue teniendo cefaleas despus de Education officer, environmental los tratamientos que el mdico recomend. SOLICITE ATENCIN MDICA DE INMEDIATO SI:  Tiene nuevas cefaleas.  Tiene cefaleas  que son diferentes de las que tuvo en el pasado.  Tiene adormecimiento u hormigueos en los brazos o las piernas.  Tiene cambios en la visin o en el habla. ASEGRESE DE QUE:  Comprende estas instrucciones.  Controlar el estado del [place].  Solicitar ayuda de inmediato si el nio  no mejora o si empeora. Document Released: 06/22/2008 Document Revised: 08/10/2013 [facility] Patient Information 2015 [place], [place]. This information is not intended to replace advice given to you by your health care provider. Make sure you discuss any questions you have with your health care provider. Propranolol extended-release capsules Qu es este medicamento? El PROPRANOLOL es un beta-bloqueante. Los beta-bloqueantes reducen la carga de trabajo del corazn y lo ayudan a latir ms regularmente. Este medicamento se [place] para tratar la alta presin sangunea, enfermedad del msculo del corazn y prevenir el dolor de pecho causado por la angina de pecho. Este medicamento tambin puede ayudar a Secondary school teacher. No debe tomar este medicamento para tratar Neomia Dear migraa una vez empezada. Este medicamento puede ser utilizado para otros usos; si tiene alguna pregunta consulte con su proveedor de atencin mdica o con su farmacutico. MARCAS COMERCIALES DISPONIBLES: Inderal LA, Inderal XL, InnoPran XL Qu le debo informar a mi profesional de la salud antes de tomar este medicamento? Necesita saber si usted presenta alguno de los siguientes problemas o situaciones: -problemas circulatorios o enfermedad vascular -diabetes -antecedentes de ataques o enfermedades cardacas, angina vasospstica -enfermedad heptica -enfermedad renal -enfermedad pulmonar o respiratoria, tales como enfisema o asma -feocromocitoma -frecuencia cardiaca lenta -enfermedad tiroidea -una reaccin alrgica o inusual al propranolol, a otros beta-bloqueantes, medicamentos, alimentos, colorantes o conservantes -si est embarazada o buscando quedar embarazada -si est amamantando a un beb Cmo debo utilizar este medicamento? Tome este medicamento por va oral con un vaso de agua. Siga las instrucciones de la etiqueta del [place]. No lo triture o mastique. Tome sus dosis a intervalos regulares. No tome su medicamento con  una frecuencia mayor que la indicada. No deje de tomarlo excepto si as lo indica su mdico o profesional de [employer]. Hable con su pediatra para informarse acerca del uso de este medicamento en nios. Puede requerir atencin especial. Sobredosis: Pngase en contacto inmediatamente con un centro toxicolgico o una sala de urgencia si usted cree que haya tomado demasiado medicamento. ATENCIN: [employer] es solo para usted. No comparta este medicamento con nadie. Qu sucede si me olvido de una dosis? Si olvida una dosis, tmela lo antes posible. Si es casi la hora de la prxima dosis, tome slo esa dosis. No tome dosis adicionales o dobles. Qu puede interactuar con este medicamento? No tome esta medicina con ninguno de los siguientes medicamentos: -espino blanco -fenotiazinas, tales como clorpromacina, mesoridazina, proclorperazina, tioridazina Esta medicina tambin puede interactuar con los siguientes medicamentos: -gel de hidrxido de aluminio -antipirina -medicamentos antivirales para el VIH o SIDA -barbitricos, como fenobarbital -ciertos medicamentos para la presin sangunea, enfermedad cardiaca, pulso cardiaco irregular -cimetidina -ciprofloxacina -diazepam -fluconazol -haloperidol -isoniazida -medicamentos para el colesterol, tales como colestiramina o colestipol -medicamentos para la depresin mental -medicamentos para las migraas, tales como almotriptn, eletriptn, frovatriptn, naratriptn, rizatriptn, sumatriptn, zolmitriptn -los [place], medicamentos para Chief Technology Officer y inflamacin, como el ibuprofeno o naproxeno -fenitona -rifampicina -teniposida -teofilina -medicamentos tiroideos -tolbutamida -warfarina -zileuton Puede ser que esta lista no menciona todas las posibles interacciones. Informe a su profesional de [employer] de [employer] Inc productos a base de hierbas, medicamentos de [place] o suplementos nutritivos que est tomando. Si usted fuma,  consume bebidas  alcohlicas o si utiliza drogas ilegales, indqueselo tambin a su profesional de [employer]. Algunas sustancias pueden interactuar con su medicamento. A qu debo estar atento al usar [employer]? Visite a su mdico o a su profesional de la salud para chequear su evolucin peridicamente. Si sus sntomas empeoran, comunquese inmediatamente con su mdico. Controle su presin sangunea y su frecuencia cardiaca regularmente. Pregunte a su profesional de la salud cules deben ser su frecuencia cardiaca y su presin sangunea y cundo deber comunicarse con l/ella. No suspenda este medicamento de [employer]. Puede causarle serios problemas cardiacos. Puede experimentar somnolencia o mareos. No conduzca ni utilice maquinaria, ni haga nada que Scientist, research (life sciences) en estado de alerta hasta que sepa cmo le afecta este medicamento. No se ponga de pie ni se siente rpidamente, especialmente si es [address] persona de edad avanzada. Esto reduce el riesgo de mareos o [employer]. El alcohol puede aumentar los mareos y la somnolencia. Evite consumir bebidas alcohlicas. Este medicamento puede afectar sus niveles de [place]. Si tiene diabetes, consulte con su mdico o con su profesional de la salud antes de cambiar su dieta o la dosis de su medicamento para la diabetes. No se trate usted mismo si tiene tos, resfro o Scientist, research (physical sciences) est tomando este medicamento sin Science writer con su mdico o con su profesional de [employer]. Algunos ingredientes pueden aumentar su presin sangunea. Qu efectos secundarios puedo tener al [employer] este medicamento? Efectos secundarios que debe informar a su mdico o a Producer, television/film/video de la salud tan pronto como sea posible: -Therapist, art como erupcin cutnea, picazn o urticarias, hinchazn de la cara, labios o lengua -problemas respiratorios -cambios en el nivel de azcar en la sangre -enfriamiento de manos o pies -dificultad para dormir, pesadillas -descamacin  de piel seca -alucinaciones -calambres o debilidad muscular -frecuencia cardiaca lenta -hinchazn de piernas y tobillos -vmito Efectos secundarios que, por lo general, no requieren atencin mdica (debe informarlos a su mdico o a su profesional de la salud si persisten o si son molestos): -cambios en el deseo sexual o capacidad -diarrea -ojos secos y doloridos -cada del cabello -nuseas -debilidad o cansancio Puede ser que esta lista no menciona todos los posibles efectos secundarios. Comunquese a su mdico por asesoramiento mdico [employer]lett[employer]ard. Usted puede informar los efectos secundarios a la FDA por telfono al 1-800-FDA-1088. Dnde debo guardar mi medicina? Mantngala fuera del alcance de los nios. Gurdela a [employer], entre 15 y 30 grados C (63 y 44 grados F). Protjala de la luz y la humedad. No la congele. Mantenga el envase bien cerrado. Deseche todo medicamento que no haya utilizado, despus de su fecha de vencimiento. ATENCIN: Este folleto es un resumen. Puede ser que no cubra toda la posible informacin. Si usted tiene preguntas acerca de esta medicina, consulte con su mdico, su farmacutico o su profesional de Radiographer, therapeutic.  2015, Elsevier/Gold Standard. (2012-12-15 16:46:08) Sumatriptan tablets Qu es este medicamento? El SUMATRIPTAN se [place] para tratar las migraas con o sin aura. Una aura es una sensacin o perturbacin visual particular que advierte sobre el ataque. Este medicamento no se [place] para prevenir ataques de [place]. Este medicamento puede ser utilizado para otros usos; si tiene alguna pregunta consulte con su proveedor de atencin mdica o con su farmacutico. MARCAS COMERCIALES DISPONIBLES: Imitrex Qu le debo informar a mi profesional de la salud antes de tomar este medicamento? Necesita saber si usted presenta alguno de los [address] o  situaciones: -enfermedad intestinal o colitis -diabetes -antecedentes  familiares de enfermedad cardiaca -pulso cardiaco rpido o irregular -enfermedad cardiaca o vascular, angina (dolor en el pecho) o ataque cardiaco previo -alta presin sangunea -alto nivel de colesterol -antecedentes de derrames cerebrales, accidentes isqumicos transitorios (AITs o "mini ataques") o hemorragias intracraneales -enfermedad heptica o renal -sobrepeso -mala circulacin -postmenopusica o extirpacin quirrgica del tero y los ovarios -enfermedad de [place] -trastorno de convulsiones -una reaccin alrgica o inusual al sumatriptn, otros medicamentos, alimentos, colorantes o conservantes -si est embarazada o buscando quedar embarazada -si est amamantando a un beb Cmo debo utilizar este medicamento? Tome este medicamento por va oral con un vaso de agua. Siga las instrucciones de la etiqueta del [place]. Utilice este medicamento al sentir los primeros sntomas de un ataque de [place]. Este medicamento no es para uso cotidiano. Si despus de una dosis su migraa reaparece, puede tomar otra dosis segn las indicaciones dadas. Debe haber un intervalo de por lo menos 2 horas entre una dosis y la siguiente. No utilice ms de 100 mg por dosis. No tome ms de 200 mg en total por cada perodo de 24 hs. Si los sntomas no mejoran en absoluto despus de la primera dosis, no tome una segunda dosis sin consultar con su mdico o con su profesional de Radiographer, therapeutic. No tome su medicamento con una frecuencia mayor que la indicada. Hable con su pediatra para informarse acerca del uso de este medicamento en nios. Puede requerir atencin especial. Sobredosis: Pngase en contacto inmediatamente con un centro toxicolgico o una sala de urgencia si usted cree que haya tomado demasiado medicamento. ATENCIN: [employer] es solo para usted. No comparta este medicamento con nadie. Qu sucede si me olvido de una dosis? No se aplica en este caso. Este medicamento no es para uso regular. Qu  puede interactuar con este medicamento? No tome esta medicina con ninguno de los medicamentos siguientes: -anfetamina o cocana -dihidroergotamina, ergotamina, mesilatos ergoloides, metisergida o medicamentos de tipo ergotnico - no tomarlos durante 24 horas despus de Biomedical scientist. -altamisa -IMAOs, tales como Carbex, Eldepryl, Marplan, Nardil y Parnate - no tomar sumatriptn hasta 2 semanas despus de interrumpir el tratamiento con IMAOs. -otros medicamentos para migraas, tales como almotriptn, eletriptn, naratriptn, rizatriptn, zolmitriptn - no tomarlos durante 24 horas despus de tomar sumatriptn. -triptfano Esta medicina tambin puede interactuar con los siguientes medicamentos: -litio -medicamentos para la depresin mental, la ansiedad o los estados de nimo -medicamentos para perder peso, tales como dexfenfluramina, dextroanfetamina, fenfluramina o sibutramine -hipericina (hierba de [place]) Puede ser que esta lista no menciona todas las posibles interacciones. Informe a su profesional de [employer] de In[employer] Inc productos a base de hierbas, medicamentos de [place] o suplementos nutritivos que est tomando. Si usted fuma, consume bebidas alcohlicas o si utiliza drogas ilegales, indqueselo tambin a su profesional de [employer]. Algunas sustancias pueden interactuar con su medicamento. A qu debo estar atento al usar [employer] Corporation? Slo tome este medicamento cuando tenga migraas. Tmelo al sentir sntomas de advertencia o al comienzo de un ataque de migraa. No debe utilizarlo en forma regular para prevenir migraas. Puede experimentar somnolencia o mareos. No conduzca ni utilice maquinaria ni haga nada que Scientist, research (life sciences) en estado de alerta hasta que sepa cmo le afecta este medicamento. Para reducir los [employer], no se siente ni se ponga de pie con rapidez, especialmente si es un paciente de edad avanzada. El alcohol puede aumentar su somnolencia, su  sensacin de  mareo y su enrojecimiento. Evite consumir bebidas alcohlicas. Al fumar, puede aumentar el riesgo de efectos secundarios relacionados con el corazn por el uso de [place]. Si toma medicamentos para migraas por 10 das o ms en un mes, las migraas pueden [address] Mantenga un diario de [address]  [address] de [address] de [place] y el uso de [place]. Consulte a su profesional de la salud si los ataques de migraas ocurren con ms frecuencia. Qu efectos secundarios puedo tener al [employer] este medicamento? Efectos secundarios que debe informar a su mdico o a Producer, television/film/video de la salud tan pronto como sea posible: -Therapist, art como erupcin cutnea, picazn o urticarias, hinchazn de la cara, labios o lengua -pulso cardiaco rpido, lento o irregular -alucinaciones -aumento o disminucin de la presin sangunea -convulsiones -dolor de estmago y calambres severos, diarrea con Retail buyer -signos y sntomas de un cogulo sanguneo tales como problemas respiratorios; cambios en la visin; dolor en el pecho; dolor de cabeza severo, repentino; dolor, hinchazn, clida en la pierna; dificultad para hablar; entumecimiento o debilidad repentina de la cara, brazo o pierna -hormigueo, dolor o entumecimiento en la cara, las manos o los pies Efectos secundarios que, por lo general, no requieren Psychologist, prison and probation services (debe informarlos a su mdico o a su profesional de la salud si persisten o si son molestos): -somnolencia -sensacin de calor, rubor o enrojecimiento de la cara -dolor de cabeza -dolor, calambre muscular -nuseas, vmitos -cansancio o debilidad inusual Puede ser que esta lista no menciona todos los posibles efectos secundarios. Comunquese a su mdico por asesoramiento mdico [employer]ard. Usted puede informar los efectos secundarios a la FDA por telfono al 1-800-FDA-1088. Dnde debo guardar mi medicina? Mantngala fuera del alcance de los nios. Gurdela a [employer], entre 2 y 30 grados C (36 y 75 grados F). Deseche todo el medicamento que no haya utilizado, despus de la fecha de vencimiento. ATENCIN: Este folleto es un resumen. Puede ser que no cubra toda la posible informacin. Si usted tiene preguntas acerca de esta medicina, consulte con su mdico, su farmacutico o su profesional de Radiographer, therapeutic.  2015, Elsevier/Gold Standard. (2012-12-16 14:07:38)
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
## 2014-12-23 NOTE — Progress Notes (Signed)
BP 139/86 mmHg  Pulse 72  Temp(Src) 97.8 F (36.6 C)  Ht 5' 4.6" (1.641 m)  Wt 298 lb (135.172 kg)  BMI 50.20 kg/m2  SpO2 98%   Subjective:    Patient ID: Walter Pierce, male    DOB: 09/20/1970, 44 y.o.   MRN: [id number]  ROS   Physical Exam

## 2014-12-23 NOTE — Assessment & Plan Note (Signed)
HIV2 RNA also negative. Likely false negative. Will check again in 2 weeks, which will have been a month, and again in 6 months.

## 2014-12-24 NOTE — Telephone Encounter (Signed)
New result faxed to [facility].

## 2014-12-29 ENCOUNTER — Telehealth: Payer: Self-pay

## 2014-12-29 NOTE — Telephone Encounter (Signed)
Metea from [facility] called stated she has received the labs. Thanks.

## 2014-12-31 ENCOUNTER — Other Ambulatory Visit: Payer: BLUE CROSS/BLUE SHIELD

## 2015-01-10 ENCOUNTER — Encounter: Payer: Self-pay | Admitting: Emergency Medicine

## 2015-01-10 DIAGNOSIS — Z720 Tobacco use: Secondary | ICD-10-CM | POA: Insufficient documentation

## 2015-01-10 DIAGNOSIS — G8929 Other chronic pain: Secondary | ICD-10-CM | POA: Insufficient documentation

## 2015-01-10 DIAGNOSIS — M549 Dorsalgia, unspecified: Secondary | ICD-10-CM | POA: Diagnosis not present

## 2015-01-10 NOTE — ED Notes (Signed)
Pt presents to ED with chronic back pain for a year and half, worsen for last few days after he transfer a finish tank.

## 2015-01-11 ENCOUNTER — Emergency Department
Admission: EM | Admit: 2015-01-11 | Discharge: 2015-01-11 | Discharge status: Against Medical Advice | Payer: BLUE CROSS/BLUE SHIELD | Attending: Emergency Medicine | Admitting: Emergency Medicine

## 2015-01-11 ENCOUNTER — Other Ambulatory Visit: Payer: BLUE CROSS/BLUE SHIELD

## 2015-01-14 ENCOUNTER — Ambulatory Visit (INDEPENDENT_AMBULATORY_CARE_PROVIDER_SITE_OTHER): Payer: BLUE CROSS/BLUE SHIELD | Admitting: Family Medicine

## 2015-01-14 ENCOUNTER — Encounter: Payer: Self-pay | Admitting: Family Medicine

## 2015-01-14 VITALS — BP 133/85 | HR 82 | Temp 98.2°F | Ht 66.0 in | Wt 302.0 lb

## 2015-01-14 DIAGNOSIS — Z21 Asymptomatic human immunodeficiency virus [HIV] infection status: Secondary | ICD-10-CM

## 2015-01-14 DIAGNOSIS — M545 Low back pain, unspecified: Secondary | ICD-10-CM

## 2015-01-14 MED ORDER — CYCLOBENZAPRINE HCL 10 MG PO TABS
10.0000 mg | ORAL_TABLET | Freq: Three times a day (TID) | ORAL | Status: DC | PRN
Start: 2015-01-14 — End: 2015-04-25

## 2015-01-14 MED ORDER — CYCLOBENZAPRINE HCL 10 MG PO TABS: 10.0000 mg | ORAL_TABLET | Freq: Three times a day (TID) | ORAL | Status: DC | PRN

## 2015-01-14 MED ORDER — IBUPROFEN 800 MG PO TABS: 800.0000 mg | ORAL_TABLET | Freq: Three times a day (TID) | ORAL | Status: DC | PRN

## 2015-01-14 NOTE — Progress Notes (Signed)
BP 133/85 mmHg  Pulse 82  Temp(Src) 98.2 F (36.8 C)  Ht 5\' 6"  (1.676 m)  Wt 302 lb (136.986 kg)  BMI 48.77 kg/m2  SpO2 99%   Subjective:    Patient ID: Walter Pierce, male    DOB: 24-Aug-1970, 44 y.o.   MRN: [id number]  HPI: Walter Pierce is a 44 y.o. male  Chief Complaint  Patient presents with  . Back Pain    low back pain, patient was moving a fish tank full of water, he heard a pop in his back. This happened on Sunday or Monday   BACK PAIN Duration: 5 days ago Mechanism of injury: lifting Location: bilateral and low back Onset: sudden Severity: goes up and down, gets up to 9/10 Quality: cramping and pulling Frequency: intermittent Radiation: up into upper back Aggravating factors: lifting, movement, walking, laying, bending and prolonged sitting Alleviating factors: nothing Status: stable Treatments attempted: rest and ibuprofen  Relief with NSAIDs?: no Nighttime pain:  yes with movement Paresthesias / decreased sensation:  no Bowel / bladder incontinence:  no Fevers:  no Dysuria / urinary frequency:  no  Relevant past medical, surgical, family and social history reviewed and updated as indicated. Interim medical history since our last visit reviewed. Allergies and medications reviewed and updated.  Review of Systems  Constitutional: Negative.   Respiratory: Negative.   Cardiovascular: Negative.   Gastrointestinal: Negative.   Musculoskeletal: Positive for myalgias, back pain and gait problem. Negative for joint swelling, arthralgias, neck pain and neck stiffness.  Psychiatric/Behavioral: Negative.     Per HPI unless specifically indicated above     Objective:    BP 133/85 mmHg  Pulse 82  Temp(Src) 98.2 F (36.8 C)  Ht 5\' 6"  (1.676 m)  Wt 302 lb (136.986 kg)  BMI 48.77 kg/m2  SpO2 99%  Wt Readings from Last 3 Encounters:  01/14/15 302 lb (136.986 kg)  01/10/15 204 lb (92.534 kg)  12/23/14 298 lb (135.172 kg)    Physical Exam   Constitutional: He is oriented to person, place, and time. He appears well-developed and well-nourished. No distress.  HENT:  Head: Normocephalic and atraumatic.  Right Ear: Hearing normal.  Left Ear: Hearing normal.  Nose: Nose normal.  Eyes: Conjunctivae and lids are normal. Right eye exhibits no discharge. Left eye exhibits no discharge. No scleral icterus.  Cardiovascular: Normal rate, regular rhythm, normal heart sounds and intact distal pulses.  Exam reveals no gallop and no friction rub.   No murmur heard. Pulmonary/Chest: Effort normal and breath sounds normal. No respiratory distress. He has no wheezes. He has no rales. He exhibits no tenderness.  Neurological: He is alert and oriented to person, place, and time. He has normal reflexes. He displays normal reflexes. No cranial nerve deficit. He exhibits normal muscle tone. Coordination normal.  Skin: Skin is warm, dry and intact. No rash noted. No erythema. No pallor.  Psychiatric: He has a normal mood and affect. His speech is normal and behavior is normal. Judgment and thought content normal. Cognition and memory are normal.  Nursing note and vitals reviewed. Back Exam:    Inspection:  Normal spinal curvature.  No deformity, ecchymosis, erythema, or lesions     Palpation:     Midline spinal tenderness: no      Paralumbar tenderness: yes bilateral     Parathoracic tenderness: no      Buttocks tenderness: no     Range of Motion:      Flexion: Fingers to  Knees     Extension:Decreased     Lateral bending:Decreased    Rotation:Decreased    Neuro Exam:    Special Tests:      Straight leg raise:negative   Results for orders placed or performed in visit on 12/06/14  GC/Chlamydia Probe Amp  Result Value Ref Range   Chlamydia trachomatis, NAA Negative Negative   Neisseria gonorrhoeae by PCR Negative Negative  Bayer DCA Hb A1c Waived  Result Value Ref Range   Bayer DCA Hb A1c Waived 5.3 <7.0 %  CBC With Differential/Platelet   Result Value Ref Range   WBC 5.7 3.4 - 10.8 x10E3/uL   RBC 5.20 4.14 - 5.80 x10E6/uL   Hemoglobin 17.4 12.6 - 17.7 g/dL   Hematocrit 64.4 03.4 - 51.0 %   MCV 95 79 - 97 fL   MCH 33.5 (H) 26.6 - 33.0 pg   MCHC 35.3 31.5 - 35.7 g/dL   RDW 74.2 59.5 - 63.8 %   Platelets 170 150 - 379 x10E3/uL   Neutrophils 45 %   Lymphs 45 %   MID 10 %   Neutrophils Absolute 2.5 1.4 - 7.0 x10E3/uL   Lymphocytes Absolute 2.6 0.7 - 3.1 x10E3/uL   MID (Absolute) 0.6 0.1 - 1.6 X10E3/uL  Comprehensive metabolic panel  Result Value Ref Range   Glucose 76 65 - 99 mg/dL   BUN 16 6 - 24 mg/dL   Creatinine, Ser 7.56 0.76 - 1.27 mg/dL   GFR calc non Af Amer 108 >59 mL/min/1.73   GFR calc Af Amer 125 >59 mL/min/1.73   BUN/Creatinine Ratio 19 9 - 20   Sodium 141 134 - 144 mmol/L   Potassium 4.9 3.5 - 5.2 mmol/L   Chloride 100 97 - 108 mmol/L   CO2 25 18 - 29 mmol/L   Calcium 9.3 8.7 - 10.2 mg/dL   Total Protein 6.9 6.0 - 8.5 g/dL   Albumin 3.9 3.5 - 5.5 g/dL   Globulin, Total 3.0 1.5 - 4.5 g/dL   Albumin/Globulin Ratio 1.3 1.1 - 2.5   Bilirubin Total 0.6 0.0 - 1.2 mg/dL   Alkaline Phosphatase 86 39 - 117 IU/L   AST 23 0 - 40 IU/L   ALT 30 0 - 44 IU/L  Lipid Panel Piccolo, Waived  Result Value Ref Range   Cholesterol Piccolo, Waived 189 <200 mg/dL   HDL Chol Piccolo, Waived 45 (L) >59 mg/dL   Triglycerides Piccolo,Waived 180 (H) <150 mg/dL   Chol/HDL Ratio Piccolo,Waive 4.2 mg/dL   LDL Chol Calc Piccolo Waived 107 (H) <100 mg/dL   VLDL Chol Calc Piccolo,Waive 36 (H) <30 mg/dL  Microalbumin, Urine Waived  Result Value Ref Range   Microalb, Ur Waived 10 0 - 19 mg/L   Creatinine, Urine Waived 200 10 - 300 mg/dL   Microalb/Creat Ratio <30 <30 mg/g  TSH  Result Value Ref Range   TSH 1.130 0.450 - 4.500 uIU/mL  UA/M w/rflx Culture, Routine  Result Value Ref Range   Specific Gravity, UA 1.015 1.005 - 1.030   pH, UA 6.5 5.0 - 7.5   Color, UA Yellow Yellow   Appearance Ur Clear Clear   Leukocytes,  UA Negative Negative   Protein, UA Negative Negative/Trace   Glucose, UA Negative Negative   Ketones, UA Negative Negative   RBC, UA Negative Negative   Bilirubin, UA Negative Negative   Urobilinogen, Ur 1.0 0.2 - 1.0 mg/dL   Nitrite, UA Negative Negative  HIV antibody  Result Value Ref Range  HIV Screen 4th Generation wRfx Comment Non Reactive  Hepatitis, Acute  Result Value Ref Range   Hep A IgM Negative Negative   Hepatitis B Surface Ag Negative Negative   Hep B C IgM Negative Negative   Hep C Virus Ab <0.1 0.0 - 0.9 s/co ratio  RPR  Result Value Ref Range   RPR Ser Ql Non Reactive Non Reactive  HSV(herpes simplex vrs) 1+2 ab-IgG  Result Value Ref Range   HSV 1 Glycoprotein G Ab, IgG 47.00 (H) 0.00 - 0.90 index   HSV 2 Glycoprotein G Ab, IgG 2.11 (H) 0.00 - 0.90 index  HIV 1/2 Ab Differentiation  Result Value Ref Range   HIV 1 AB Negative Negative   HIV 2 AB Negative Negative   Interpretation: Comment   RNA Qualitative  Result Value Ref Range   HIV 1 RNA Qualitative Negative Negative   Final Interpretation Comment       Assessment & Plan:   Problem List Items Addressed This Visit      Other   HIV test positive (HCC)    Will retest today for his 1 month test. Assume false positive. Will recheck again in 6 months.       Relevant Orders   HIV antibody    Other Visit Diagnoses    Bilateral low back pain without sciatica    -  Primary    Seems to be an acute muscle spasm. Normal neurologic exam. Flexeril and ibuprofen. Exercises given. follow up in 2 weeks to make sure he is getting better.    Relevant Medications    cyclobenzaprine (FLEXERIL) 10 MG tablet    ibuprofen (ADVIL,MOTRIN) 800 MG tablet        Follow up plan: Return in about 2 weeks (around 01/28/2015).

## 2015-01-14 NOTE — Assessment & Plan Note (Signed)
Will retest today for his 1 month test. Assume false positive. Will recheck again in 6 months.

## 2015-01-14 NOTE — Patient Instructions (Signed)
Ejercicios para la espalda °(Back Exercises) °Los siguientes ejercicios fortalecen los músculos que dan soporte a la espalda y, además, ayudan a mantener la flexibilidad de la zona lumbar. Hacer estos ejercicios puede ser de ayuda para evitar el dolor de espalda o aliviar el dolor actual. °Si tiene dolor o molestias en la espalda, intente hacer estos ejercicios 2 o 3 veces por día, o como se lo haya indicado el médico. Cuando el dolor desaparezca, hágalos una vez por día, pero haga más repeticiones de cada ejercicio. Si no tiene dolor o molestias en la espalda, haga estos ejercicios una vez por día o como se lo haya indicado el médico. °EJERCICIOS °Rodilla al pecho °Repita estos pasos 3 o 5 veces con cada pierna: °1. Acuéstese boca arriba sobre una cama dura o sobre el suelo con las piernas extendidas. °2. Lleve una rodilla al pecho. La otra pierna debe quedar extendida y en contacto con el suelo. °3. Mantenga la rodilla contra el pecho. Para lograrlo tómese la rodilla o el muslo. °4. Tire de la rodilla hasta sentir una elongación suave en la parte baja de la espalda. °5. Mantenga la elongación durante 10 a 30 segundos. °6. Suelte y extienda la pierna lentamente. °Inclinación de la pelvis °Repita estos pasos 5 o 10 veces: °1. Acuéstese boca arriba sobre una cama dura o sobre el suelo con las piernas extendidas. °2. Flexione las rodillas de modo que apunten al techo y los pies queden apoyados en el suelo. °3. Contraiga los músculos de la parte baja del abdomen para empujar la zona lumbar contra el suelo. Con este movimiento se inclinará la pelvis de modo que el cóccix apunte hacia el techo, en lugar de apuntar en dirección a los pies o al suelo. °4. Contraiga suavemente y respire con normalidad mientras mantiene esta posición durante 5 a 10 segundos. °El perro y el gato °Repita estos pasos hasta que la zona lumbar se vuelva más flexible: °1. Apoye las palmas de las manos y las rodillas sobre una superficie firme. Las  manos deben estar alineadas con los hombros y las rodillas con las caderas. Puede colocarse almohadillas debajo de las rodillas para estar cómodo. °2. Deje caer la cabeza y baje el cóccix en dirección al suelo de modo que la zona lumbar se arquee como el lomo de un gato asustado. °3. Mantenga esta posición durante 5 segundos. °4. Lentamente, levante la cabeza y eleve el cóccix de modo que apunte en dirección al techo para que la espalda forme un arco hundido como el lomo de un perro contento. °5. Mantenga esta posición durante 5 segundos. °Flexiones de brazos °Repita estos pasos 5 o 10 veces: °1. Acuéstese boca abajo en el suelo. °2. Coloque las palmas de las manos cerca de la cabeza, separadas aproximadamente al ancho de los hombros. °3. Con la espalda lo más relajada posible y las caderas apoyadas en el suelo, extienda lentamente los brazos para levantar la mitad superior del cuerpo y elevar los hombros. No use los músculos de la espalda para elevar la parte superior del torso. Puede cambiar las manos de lugar para estar más cómodo. °4. Mantenga esta posición durante 5 segundos mientras mantiene la espalda relajada. °5. Lentamente vuelva a la posición horizontal. °Puentes °Repita estos pasos 10 veces: °1. Acuéstese boca arriba sobre una superficie firme. °2. Flexione las rodillas de modo que apunten al techo y los pies queden apoyados en el suelo. °3. Contraiga los glúteos y despegue las nalgas del suelo hasta que la cintura esté casi a la misma altura que las rodillas. Debe   sentir el trabajo muscular en las nalgas y la parte posterior de los muslos. Si no siente el esfuerzo de estos músculos, aleje los pies 1 o 2 pulgadas (2,5 o 5 centímetros) de las nalgas. °4. Mantenga esta posición durante 3 a 5 segundos. °5. Baje lentamente las caderas a la posición inicial y relaje los glúteos por completo. °Si este ejercicio le resulta muy fácil, intente realizarlo con los brazos cruzados sobre el  pecho. °Abdominales °Repita estos pasos 5 o 10 veces: °1. Acuéstese boca arriba sobre una cama dura o sobre el suelo con las piernas extendidas. °2. Flexione las rodillas de modo que apunten al techo y los pies queden apoyados en el suelo. °3. Cruce los brazos sobre el pecho. °4. Baje levemente el mentón en dirección al pecho sin doblar el cuello. °5. Contraiga los músculos del abdomen y con lentitud eleve el torso lo suficiente como para despegar levemente los omóplatos del suelo. No eleve el torso más alto que eso, porque esto puede sobreexigir a la zona lumbar y no ayuda a fortalecer los músculos abdominales. °6. Regrese lentamente a la posición inicial. °Elevaciones de espalda °Repita estos pasos 5 o 10 veces: °1. Acuéstese boca abajo con los brazos a los costados del cuerpo y apoye la frente en el suelo. °2. Contraiga los músculos de las piernas y los glúteos. °3. Lentamente despegue el pecho del suelo mientras mantiene las caderas bien apoyadas en el suelo. Mantenga la nuca alineada con la curvatura de la espalda. Los ojos deben mirar al suelo. °4. Mantenga esta posición durante 3 a 5 segundos. °5. Regrese lentamente a la posición inicial. °SOLICITE ATENCIÓN MÉDICA SI: °· El dolor o las molestias en la espalda se vuelven mucho más intensos cuando hace un ejercicio. °· El dolor o las molestias en la espalda no se alivian en el término de las 2 horas posteriores a hacer los ejercicios. °Si tiene alguno de estos problemas, deje de hacer los ejercicios de inmediato. No vuelva a hacer los ejercicios a menos que el médico lo autorice. °SOLICITE ATENCIÓN MÉDICA DE INMEDIATO SI: °· Siente un dolor súbito e intenso en la espalda. Si esto ocurre, deje de hacer los ejercicios de inmediato. No vuelva a hacer los ejercicios a menos que el médico lo autorice. °  °Esta información no tiene como fin reemplazar el consejo del médico. Asegúrese de hacerle al médico cualquier pregunta que tenga. °  °Document Released: 03/26/2005  Document Revised: 12/15/2014 °Elsevier Interactive Patient Education ©2016 [employer] ° °

## 2015-01-18 ENCOUNTER — Telehealth: Payer: Self-pay | Admitting: Family Medicine

## 2015-01-18 DIAGNOSIS — Z21 Asymptomatic human immunodeficiency virus [HIV] infection status: Secondary | ICD-10-CM

## 2015-01-18 LAB — HIV ANTIBODY (ROUTINE TESTING W REFLEX)

## 2015-01-18 LAB — RNA QUALITATIVE: HIV 1 RNA Qualitative: NEGATIVE

## 2015-01-18 LAB — HIV 1/2 AB DIFFERENTIATION
HIV 1 Ab: NEGATIVE
HIV 2 Ab: NEGATIVE

## 2015-01-18 NOTE — Telephone Encounter (Signed)
Please let Walter Pierce know that his HIV came back negative again, so it's probably a false positive- we'll check again in 6 months.

## 2015-01-18 NOTE — Telephone Encounter (Signed)
Called language line, no answer left a voicemail for for a return call.

## 2015-01-21 ENCOUNTER — Ambulatory Visit: Payer: BLUE CROSS/BLUE SHIELD | Admitting: Family Medicine

## 2015-01-21 NOTE — Telephone Encounter (Signed)
Called language lane, no answer left voicemail for patient to return my call.

## 2015-01-28 ENCOUNTER — Ambulatory Visit: Payer: BLUE CROSS/BLUE SHIELD | Admitting: Family Medicine

## 2015-02-04 NOTE — Telephone Encounter (Signed)
Patient notified

## 2015-04-25 ENCOUNTER — Emergency Department: Payer: BLUE CROSS/BLUE SHIELD

## 2015-04-25 ENCOUNTER — Encounter: Payer: Self-pay | Admitting: *Deleted

## 2015-04-25 ENCOUNTER — Emergency Department
Admission: EM | Admit: 2015-04-25 | Discharge: 2015-04-25 | Disposition: A | Discharge status: Home / Self Care | Payer: BLUE CROSS/BLUE SHIELD | Attending: Emergency Medicine | Admitting: Emergency Medicine

## 2015-04-25 DIAGNOSIS — F17210 Nicotine dependence, cigarettes, uncomplicated: Secondary | ICD-10-CM | POA: Insufficient documentation

## 2015-04-25 DIAGNOSIS — M545 Low back pain: Secondary | ICD-10-CM | POA: Insufficient documentation

## 2015-04-25 HISTORY — DX: Atherosclerotic heart disease of native coronary artery without angina pectoris: I25.10

## 2015-04-25 MED ORDER — DIAZEPAM 5 MG/ML IJ SOLN
5.0000 mg | Freq: Once | INTRAMUSCULAR | Status: AC
  Administered 2015-04-25: 5 mg via INTRAMUSCULAR
  Filled 2015-04-25: qty 2

## 2015-04-25 MED ORDER — NAPROXEN 500 MG PO TABS: 500.0000 mg | ORAL_TABLET | Freq: Two times a day (BID) | ORAL | Status: DC

## 2015-04-25 MED ORDER — METHOCARBAMOL 500 MG PO TABS: 500.0000 mg | ORAL_TABLET | Freq: Four times a day (QID) | ORAL | Status: DC | PRN

## 2015-04-25 MED ORDER — HYDROCODONE-ACETAMINOPHEN 5-325 MG PO TABS: 1.0000 | ORAL_TABLET | ORAL | Status: DC | PRN

## 2015-04-25 MED ORDER — HYDROCODONE-ACETAMINOPHEN 5-325 MG PO TABS
2.0000 | ORAL_TABLET | Freq: Once | ORAL | Status: AC
Start: 2015-04-25 — End: 2015-04-25
  Administered 2015-04-25: 2 via ORAL
  Filled 2015-04-25: qty 2

## 2015-04-25 MED ORDER — KETOROLAC TROMETHAMINE 60 MG/2ML IM SOLN
60.0000 mg | Freq: Once | INTRAMUSCULAR | Status: AC
  Administered 2015-04-25: 60 mg via INTRAMUSCULAR
  Filled 2015-04-25: qty 2

## 2015-04-25 NOTE — ED Notes (Signed)
Low back pain past 2 days,

## 2015-04-25 NOTE — Discharge Instructions (Signed)
Dolor de espalda en adultos °(Back Pain, Adult) °El dolor de espalda es muy frecuente en los adultos. La causa del dolor de espalda es rara vez peligrosa y el dolor a menudo mejora con el tiempo. Es posible que se desconozca la causa de esta afección. Algunas causas comunes son las siguientes: °· Distensión de los músculos o ligamentos que sostienen la columna vertebral. °· Desgaste (degeneración) de los discos vertebrales. °· Artritis. °· Lesiones directas en la espalda. °En muchas personas, el dolor de espalda es recurrente. Como rara vez es peligroso, las personas pueden aprender a manejar esta afección por sí mismas. °INSTRUCCIONES PARA EL CUIDADO EN EL HOGAR °Controle su dolor de espalda a fin de detectar algún cambio. Las siguientes indicaciones ayudarán a aliviar cualquier molestia que pueda sentir: °· Permanezca activo. Si permanece sentado o de pie en un mismo lugar durante mucho tiempo, se tensiona la espalda. No se siente, conduzca o permanezca de pie en un mismo lugar durante más de 30 minutos seguidos. Realice caminatas cortas en superficies planas tan pronto como le sea posible. Trate de caminar un poco más de tiempo cada día. °· Haga ejercicio regularmente como se lo haya indicado el médico. El ejercicio ayuda a que su espalda se cure más rápidamente. También ayuda a prevenir futuras lesiones al mantener los músculos fuertes y flexibles. °· No permanezca en la cama. Si hace reposo más de 1 a 2 días, puede demorar su recuperación. °· Preste atención a su cuerpo al inclinarse y levantarse. Las posiciones más cómodas son las que ejercen menos tensión en la espalda en recuperación. Siempre use técnicas apropiadas para levantar objetos, como por ejemplo: °· Flexionar las rodillas. °· Mantener la carga cerca del cuerpo. °· No torcerse. °· Encuentre una posición cómoda para dormir. Use un colchón firme y recuéstese de costado con las rodillas ligeramente flexionadas. Si se recuesta sobre la espalda, coloque  una almohada debajo de las rodillas. °· Evite sentir ansiedad o estrés. El estrés aumenta la tensión muscular y puede empeorar el dolor de espalda. Es importante reconocer si se siente ansioso o estresado y aprender maneras de controlarlo, por ejemplo haciendo ejercicio. °· Tome los medicamentos solamente como se lo haya indicado el médico. Los medicamentos de venta libre para aliviar el dolor y la inflamación a menudo son los más eficaces. El médico puede recetarle relajantes musculares. Estos medicamentos ayudan a calmar el dolor de modo que pueda reanudar más rápidamente sus actividades normales y el ejercicio saludable. °· Aplique hielo sobre la zona lesionada. °· Ponga el hielo en una bolsa plástica. °· Coloque una toalla entre la piel y la bolsa de hielo. °· Deje el hielo durante 20 minutos, 2 a 3 veces por día, durante los primeros 2 o 3 días. Después de eso, puede alternar el hielo y el calor para reducir el dolor y los espasmos. °· Mantenga un peso saludable. El exceso de peso ejerce presión adicional sobre la espalda y hace que resulte difícil mantener una buena postura. °SOLICITE ATENCIÓN MÉDICA SI: °· Siente un dolor que no se alivia con reposo o medicamentos. °· Siente mucho dolor que se extiende a las piernas o los glúteos. °· El dolor no mejora en una semana. °· Siente dolor por la noche. °· Pierde peso. °· Siente escalofríos o fiebre. °SOLICITE ATENCIÓN MÉDICA DE INMEDIATO SI:  °· Tiene nuevos problemas para controlar la vejiga o los intestinos. °· Siente debilidad o adormecimiento inusuales en los brazos o en las piernas. °· Siente náuseas o vómitos. °· Siente dolor abdominal. °· Siente que va a desmayarse. °  °  Esta informacin no tiene Theme park manager el consejo del mdico. Asegrese de hacerle al mdico cualquier pregunta que tenga.   Document Released: 03/26/2005 Document Revised: 04/16/2014 Elsevier Interactive Patient Education 2016 [employer].  [employer]  msculoesqueltico (Musculoskeletal Pain) El dolor musculoesqueltico se siente en huesos y msculos. El dolor puede ocurrir en cualquier parte del cuerpo. El profesional que lo asiste podr tratarlo sin Geologist, engineering causa del dolor. Lo tratar [employer] de laboratorio (sangre y [place]), las radiografas y otros estudios sean normales. La causa de estos dolores puede ser un virus.  CAUSAS Generalmente no existe una causa definida para este trastorno. Tambin el [employer] puede deberse a la [place]. En la actividad excesiva se incluye el hacer ejercicios fsicos muy intensos cuando no se est en buena forma. El dolor de huesos tambin puede deberse a cambios climticos. Los huesos son sensibles a los cambios en la presin atmosfrica. INSTRUCCIONES PARA EL CUIDADO DOMICILIARIO  Para proteger su privacidad, no se entregarn los [employer] pruebas por telfono. Asegrese de conseguirlos. Consulte el modo en que podr obtenerlos si no se lo han informado. Es su responsabilidad contar con los [employer].  Utilice los medicamentos de venta libre o de prescripcin para Chief Technology Officer, Environmental health practitioner o la [place], segn se lo indique el profesional que lo asiste. Si le han administrado medicamentos, no conduzca, no opere maquinarias ni Diplomatic Services operational officer, y tampoco firme documentos legales durante 24 horas. No beba alcohol. No tome pldoras para dormir ni otros medicamentos que Museum/gallery curator.  Podr seguir con todas las actividades a menos que stas le ocasionen ms [employer]. Cuando el dolor disminuya, es importante que gradualmente reanude toda la rutina habitual. Retome las actividades comenzando lentamente. Aumente gradualmente la intensidad y la duracin de sus actividades o del ejercicio.  Durante los perodos de dolor intenso, el reposo en cama puede ser beneficioso. Recustese o sintese en la posicin que le sea ms cmoda.  Coloque hielo sobre la  zona afectada.  Ponga hielo en [employer] Shut[employer].  Colquese una toalla entre la piel y la bolsa de hielo.  Aplique el hielo durante 10 a 20 minutos 3  4 veces por da.  Si el dolor empeora, o no desaparece puede ser [employer] repetir las pruebas o Education officer, environmental nuevos exmenes. El profesional que lo asiste podr requerir investigar ms profundamente para Veterinary surgeon causa posible. SOLICITE ATENCIN MDICA DE INMEDIATO SI:  Siente que el dolor empeora y no se alivia con los medicamentos.  Siente dolor en el pecho asociado a falta de aire, sudoracin, nuseas o vmitos.  El dolor se localiza en el abdomen.  Comienza a sentir nuevos sntomas que parecen ser diferentes o que lo preocupan. ASEGRESE DE QUE:   Comprende las instrucciones para el alta mdica.  Controlar su enfermedad.  Solicitar atencin mdica de inmediato segn las indicaciones.   Esta informacin no tiene Theme park manager el consejo del mdico. Asegrese de hacerle al mdico cualquier pregunta que tenga.   Document Released: 01/03/2005 Document Revised: 06/18/2011 Elsevier Interactive Patient Education 2016 Elsevier Inc.  Terapia con calor (Heat Therapy) La terapia con calor puede ayudar a aliviar articulaciones y msculos doloridos, lesionados, tensos y rgidos. El calor [employer], lo cual puede ayudar a Engineer, materials. La terapia con calor solo se debe usar en lesiones viejas, preexistentes o de larga duracin (crnicas). No use la terapia con calor a menos que se lo haya indicado el mdico. CMO  USAR LA TERAPIA CON CALOR Existen varios tipos distintos de terapia con calor, como:  Compresas hmedas calientes.  Bao de agua caliente.  Bolsa de agua caliente.  Almohadilla trmica.  Bolsa de gel caliente.  Vendaje caliente.  Almohadilla trmica. RECOMENDACIONES GENERALES PARA LA TERAPIA CON CALOR   No duerma mientras [place] la terapia con calor. Utilice la terapia con calor solo mientras est  despierto.  La piel puede volverse rosada mientras [place] la terapia con calor. No use la terapia con calor si la piel se pone roja.  No use la terapia con calor si siente un dolor nuevo.  Una temperatura muy alta o una exposicin prolongada al calor puede causar quemaduras. Sea cauto con la terapia de calor para evitar quemar la piel.  No use la terapia con calor en zonas de la piel que ya estn irritadas, como con una erupcin o una quemadura de sol. SOLICITE AYUDA SI:   Observa ampollas, enrojecimiento, hinchazn o adormecimiento.  Siente un dolor nuevo.  El dolor [place]ora. ASEGRESE DE QUE:  Comprende estas instrucciones.  Controlar su afeccin.  Recibir ayuda de inmediato si no mejora o si empeora.   Esta informacin no tiene Theme park managercomo fin reemplazar el consejo del mdico. Asegrese de hacerle al mdico cualquier pregunta que tenga.   Document Released: 06/18/2011 Document Revised: 04/16/2014 Elsevier Interactive Patient Education [employer]2016 Elsevier Inc.

## 2015-04-25 NOTE — ED Provider Notes (Signed)
[facility] Emergency Department Provider Note  ____________________________________________  Time seen: Approximately 1:25 PM  I have reviewed the triage vital signs and the nursing notes. History physical obtained via interpreter services, Myriam JacobsonHelen.  HISTORY  Chief Complaint Back Pain    HPI Walter Pierce is a 45 y.o. male presents for evaluation of low back pain for 2 days. Patient denies any unknown trauma just states that this morning. Progressively gotten worse.   Past Medical History  Diagnosis Date  . Coronary artery disease     Patient Active Problem List   Diagnosis Date Noted  . Migraine headache without aura 12/23/2014  . 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 Left hip pain 12/06/2014  . Morbid obesity (HCC) 12/06/2014  . Family history of diabetes mellitus (DM) 12/06/2014    Past Surgical History  Procedure Laterality Date  . Coronary angioplasty    . Coronary angioplasty with stent placement  2010  . Heart cath      Current Outpatient Rx  Name  Route  Sig  Dispense  Refill  . HYDROcodone-acetaminophen (NORCO) 5-325 MG tablet   Oral   Take 1-2 tablets by mouth every 4 (four) hours as needed for moderate pain.   15 tablet   0   . methocarbamol (ROBAXIN) 500 MG tablet   Oral   Take 1 tablet (500 mg total) by mouth every 6 (six) hours as needed for muscle spasms.   30 tablet   0   . naproxen (NAPROSYN) 500 MG tablet   Oral   Take 1 tablet (500 mg total) by mouth 2 (two) times daily with a meal.   60 tablet   0     Allergies Review of patient's allergies indicates no known allergies.  Family History  Problem Relation Age of Onset  . Diabetes Mother   . Arthritis Mother   . Hypertension Mother   . Drug abuse Mother   . Heart attack Father   . Hypertension Sister   . Diabetes Daughter   . Diabetes Maternal Grandmother   . Diabetes Maternal Grandfather   . Diabetes Paternal Grandmother   . Hypertension  Sister     Social History Social History  Substance Use Topics  . Smoking status: Current Every Day Smoker -- 1.00 packs/day    Types: Cigarettes  . Smokeless tobacco: Never Used  . Alcohol Use: No    Review of Systems Constitutional: No fever/chills Eyes: No visual changes. ENT: No sore throat. Cardiovascular: Denies chest pain. Respiratory: Denies shortness of breath. Gastrointestinal: No abdominal pain.  No nausea, no vomiting.  No diarrhea.  No constipation. Genitourinary: Negative for dysuria. Musculoskeletal: Positive for low back pain. Skin: Negative for rash. Neurological: Negative for headaches, focal weakness or numbness.  10-point ROS otherwise negative.  ____________________________________________   PHYSICAL EXAM:  VITAL SIGNS: ED Triage Vitals  Enc Vitals Group     BP 04/25/15 1230 157/87 mmHg     Pulse Rate 04/25/15 1230 101     Resp 04/25/15 1230 24     Temp 04/25/15 1230 98.1 F (36.7 C)     Temp Source 04/25/15 1230 Oral     SpO2 04/25/15 1230 96 %     Weight 04/25/15 1230 300 lb (136.079 kg)     Height 04/25/15 1230 5\' 6"  (1.676 m)     Head Cir --      Peak Flow --      Pain Score 04/25/15 1231 9  Pain Loc --      Pain Edu? --      Excl. in GC? --     Constitutional: Alert and oriented. Well appearing and in no acute distress. Eyes: Conjunctivae are normal. PERRL. EOMI. Head: Atraumatic. Nose: No congestion/rhinnorhea. Mouth/Throat: Mucous membranes are moist.  Oropharynx non-erythematous. Neck: No stridor.   Cardiovascular: Normal rate, regular rhythm. Grossly normal heart sounds.  Good peripheral circulation. Respiratory: Normal respiratory effort.  No retractions. Lungs CTAB. Gastrointestinal: Soft and nontender. No distention. No abdominal bruits. No CVA tenderness. Musculoskeletal: No lower extremity tenderness nor edema.  No joint effusions. Neurologic:  Normal speech and language. No gross focal neurologic deficits are  appreciated. No gait instability. Skin:  Skin is warm, dry and intact. No rash noted. Psychiatric: Mood and affect are normal. Speech and behavior are normal.  ____________________________________________   LABS (all labs ordered are listed, but only abnormal results are displayed)  Labs Reviewed - No data to display ____________________________________________   RADIOLOGY  Lumbar spine demonstrates no acute osseous findings. ____________________________________________   PROCEDURES  Procedure(s) performed: None  Critical Care performed: No  ____________________________________________   INITIAL IMPRESSION / ASSESSMENT AND PLAN / ED COURSE  Pertinent labs & imaging results that were available during my care of the patient were reviewed by me and considered in my medical decision making (see chart for details).  Acute lumbar myofascial strain. Rx given for Robaxin 500 mg 4 times a day,, Naprosyn 500 mg twice a day, In addition we'll prescribe Vicodin 5/325 #8. Patient follow-up with PCP or return to the ER with any worsening symptomology. Work excuse 48 hours given. ____________________________________________   FINAL CLINICAL IMPRESSION(S) / ED DIAGNOSES  Final diagnoses:  Low back pain without sciatica, unspecified back pain laterality      Evangeline Dakin, PA-C 04/25/15 1434  Evangeline Dakin, PA-C 04/25/15 1500  Myrna Blazer, MD 04/25/15 [phone]

## 2015-10-31 ENCOUNTER — Emergency Department
Admission: EM | Admit: 2015-10-31 | Discharge: 2015-10-31 | Disposition: A | Discharge status: Home / Self Care | Payer: BLUE CROSS/BLUE SHIELD | Attending: Emergency Medicine | Admitting: Emergency Medicine

## 2015-10-31 ENCOUNTER — Encounter: Payer: Self-pay | Admitting: Emergency Medicine

## 2015-10-31 DIAGNOSIS — I2510 Atherosclerotic heart disease of native coronary artery without angina pectoris: Secondary | ICD-10-CM | POA: Diagnosis not present

## 2015-10-31 DIAGNOSIS — F17210 Nicotine dependence, cigarettes, uncomplicated: Secondary | ICD-10-CM | POA: Insufficient documentation

## 2015-10-31 DIAGNOSIS — Z955 Presence of coronary angioplasty implant and graft: Secondary | ICD-10-CM | POA: Diagnosis not present

## 2015-10-31 DIAGNOSIS — Z79899 Other long term (current) drug therapy: Secondary | ICD-10-CM | POA: Insufficient documentation

## 2015-10-31 DIAGNOSIS — Z21 Asymptomatic human immunodeficiency virus [HIV] infection status: Secondary | ICD-10-CM | POA: Insufficient documentation

## 2015-10-31 DIAGNOSIS — R519 Headache, unspecified: Secondary | ICD-10-CM

## 2015-10-31 DIAGNOSIS — R51 Headache: Secondary | ICD-10-CM | POA: Insufficient documentation

## 2015-10-31 MED ORDER — KETOROLAC TROMETHAMINE 30 MG/ML IJ SOLN
30.0000 mg | Freq: Once | INTRAMUSCULAR | Status: AC
  Administered 2015-10-31: 30 mg via INTRAVENOUS
  Filled 2015-10-31: qty 1

## 2015-10-31 MED ORDER — DIPHENHYDRAMINE HCL 50 MG/ML IJ SOLN
25.0000 mg | Freq: Once | INTRAMUSCULAR | Status: AC
  Administered 2015-10-31: 25 mg via INTRAVENOUS
  Filled 2015-10-31: qty 1

## 2015-10-31 MED ORDER — SODIUM CHLORIDE 0.9 % IV BOLUS (SEPSIS)
1000.0000 mL | Freq: Once | INTRAVENOUS | Status: AC
  Administered 2015-10-31: 1000 mL via INTRAVENOUS

## 2015-10-31 MED ORDER — PROCHLORPERAZINE EDISYLATE 5 MG/ML IJ SOLN
10.0000 mg | Freq: Once | INTRAMUSCULAR | Status: AC
  Administered 2015-10-31: 10 mg via INTRAVENOUS
  Filled 2015-10-31: qty 2

## 2015-10-31 NOTE — ED Triage Notes (Signed)
Pt presents with headache since 0230. States he awakened with intense headache and sweating. He took [facility] powder with no relief. Pt reports dizziness with headache. Pt alert & oriented with NAD noted.

## 2015-10-31 NOTE — ED Provider Notes (Addendum)
[facility] Emergency Department Provider Note  ____________________________________________   I have reviewed the triage vital signs and the nursing notes.   HISTORY  Chief Complaint Headache and Dizziness    HPI Walter Pierce is a 45 y.o. male with a history of chronic daily migraines sometimes worse than others. Has been to the hospital multiple times for that. Has had negative CT scans with him he states in the past. History is per patient and also with hospital interpreter for systems. Patient states that this is a normal headache for him gradual onset not worst headache of life, associated with photophobia, nausea and sound sensitivity. Patient denies any fever or chills or stiff neck. He states this is a normal headache for him.      Past Medical History:  Diagnosis Date  . Coronary artery disease     Patient Active Problem List   Diagnosis Date Noted  . Migraine headache without aura 12/23/2014  . 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 Left hip pain 12/06/2014  . Morbid obesity (HCC) 12/06/2014  . Family history of diabetes mellitus (DM) 12/06/2014    Past Surgical History:  Procedure Laterality Date  . CORONARY ANGIOPLASTY    . CORONARY ANGIOPLASTY WITH STENT PLACEMENT  2010  . heart cath      Current Outpatient Rx  . Order #: [id number] Class: Print  . Order #: [id number] Class: Print  . Order #: [id number] Class: Print    Allergies Review of patient's allergies indicates no known allergies.  Family History  Problem Relation Age of Onset  . Diabetes Mother   . Arthritis Mother   . Hypertension Mother   . Drug abuse Mother   . Heart attack Father   . Diabetes Maternal Grandmother   . Diabetes Maternal Grandfather   . Diabetes Paternal Grandmother   . Hypertension Sister   . Diabetes Daughter   . Hypertension Sister     Social History Social History  Substance Use Topics  . Smoking status: Current Every Day Smoker     Packs/day: 1.00    Types: Cigarettes  . Smokeless tobacco: Never Used  . Alcohol use No    Review of Systems Constitutional: No fever/chills Eyes: No visual changes. ENT: No sore throat. No stiff neck no neck pain Cardiovascular: Denies chest pain. Respiratory: Denies shortness of breath. Gastrointestinal:   no vomiting.  No diarrhea.  No constipation. Genitourinary: Negative for dysuria. Musculoskeletal: Negative lower extremity swelling Skin: Negative for rash. Neurological: seehistory of present illness regarding headaches, focal weakness or numbness. 10-point ROS otherwise negative.  ____________________________________________   PHYSICAL EXAM:  VITAL SIGNS: ED Triage Vitals  Enc Vitals Group     BP 10/31/15 0929 (!) 149/59     Pulse Rate 10/31/15 0928 86     Resp 10/31/15 0928 18     Temp 10/31/15 0928 97.7 F (36.5 C)     Temp Source 10/31/15 0928 Oral     SpO2 10/31/15 0928 99 %     Weight 10/31/15 0928 (!) 325 lb (147.4 kg)     Height 10/31/15 0928 5\' 6"  (1.676 m)     Head Circumference --      Peak Flow --      Pain Score 10/31/15 0936 9     Pain Loc --      Pain Edu? --      Excl. in GC? --     Constitutional: Alert and oriented. Well appearing and in no acute distress. Eyes:  Conjunctivae are normal. PERRL. EOMI. Head: Atraumatic. Nose: No congestion/rhinnorhea. Mouth/Throat: Mucous membranes are moist.  Oropharynx non-erythematous. Neck: No stridor.   Nontender with no meningismus Cardiovascular: Normal rate, regular rhythm. Grossly normal heart sounds.  Good peripheral circulation. Respiratory: Normal respiratory effort.  No retractions. Lungs CTAB. Abdominal: Soft and nontender. No distention. No guarding no rebound Back:  There is no focal tenderness or step off.  there is no midline tenderness there are no lesions noted. there is no CVA tenderness Musculoskeletal: No lower extremity tenderness, no upper extremity tenderness. No joint effusions,  no DVT signs strong distal pulses no edema Neurologic:  Normal speech and language. No gross focal neurologic deficits are appreciated.  Skin:  Skin is warm, dry and intact. No rash noted. Psychiatric: Mood and affect are normal. Speech and behavior are normal.  ____________________________________________   LABS (all labs ordered are listed, but only abnormal results are displayed)  Labs Reviewed - No data to display ____________________________________________  EKG  I personally interpreted any EKGs ordered by me or triage  ____________________________________________  RADIOLOGY  I reviewed any imaging ordered by me or triage that were performed during my shift and, if possible, patient and/or family made aware of any abnormal findings. ____________________________________________   PROCEDURES  Procedure(s) performed: None  Procedures  Critical Care performed: None  ____________________________________________   INITIAL IMPRESSION / ASSESSMENT AND PLAN / ED COURSE  Pertinent labs & imaging results that were available during my care of the patient were reviewed by me and considered in my medical decision making (see chart for details).  The patient has a chronic recurrent headache. No warning signs to suggest this is a dural venous thrombosis, head bleed, meningitis. He is neurologically intact. We will treat him symptomatically and refer him to neurology.  ----------------------------------------- 12:55 PM on 10/31/2015 -----------------------------------------  Patient remains completely neurological intact with a NIH stroke scale of 0. Patient's GCS is 15. He has no headache he is laughing and joking and eager to go home. We will refer him to neurology as an outpatient first chronic headaches.  Clinical Course   ____________________________________________   FINAL CLINICAL IMPRESSION(S) / ED DIAGNOSES  Final diagnoses:  None      This chart was  dictated using voice recognition software.  Despite best efforts to proofread,  errors can occur which can change meaning.      Jeanmarie Plant, MD 10/31/15 1203    Jeanmarie Plant, MD 10/31/15 1255

## 2015-10-31 NOTE — ED Notes (Signed)
Per interpreter pt with headache x1 year, seems to be worse early in the mornings. Pt does not take any prescribed meds at this time for headache. NAD. Pt does states he has a hard time reading small print and has never  Been to see an eye dr.

## 2015-12-07 ENCOUNTER — Emergency Department: Payer: BLUE CROSS/BLUE SHIELD

## 2015-12-07 ENCOUNTER — Emergency Department
Admission: EM | Admit: 2015-12-07 | Discharge: 2015-12-07 | Disposition: A | Discharge status: Home / Self Care | Payer: BLUE CROSS/BLUE SHIELD | Attending: Emergency Medicine | Admitting: Emergency Medicine

## 2015-12-07 ENCOUNTER — Encounter: Payer: Self-pay | Admitting: Emergency Medicine

## 2015-12-07 DIAGNOSIS — F17210 Nicotine dependence, cigarettes, uncomplicated: Secondary | ICD-10-CM | POA: Diagnosis not present

## 2015-12-07 DIAGNOSIS — I2510 Atherosclerotic heart disease of native coronary artery without angina pectoris: Secondary | ICD-10-CM | POA: Insufficient documentation

## 2015-12-07 DIAGNOSIS — Z21 Asymptomatic human immunodeficiency virus [HIV] infection status: Secondary | ICD-10-CM | POA: Diagnosis not present

## 2015-12-07 DIAGNOSIS — M545 Low back pain, unspecified: Secondary | ICD-10-CM

## 2015-12-07 DIAGNOSIS — G8929 Other chronic pain: Secondary | ICD-10-CM | POA: Insufficient documentation

## 2015-12-07 MED ORDER — IBUPROFEN 800 MG PO TABS: 800.0000 mg | ORAL_TABLET | Freq: Three times a day (TID) | ORAL | 0 refills | Status: AC

## 2015-12-07 MED ORDER — IBUPROFEN 800 MG PO TABS
800.0000 mg | ORAL_TABLET | Freq: Once | ORAL | Status: AC
  Administered 2015-12-07: 800 mg via ORAL
  Filled 2015-12-07: qty 1

## 2015-12-07 MED ORDER — PREDNISONE 20 MG PO TABS
40.0000 mg | ORAL_TABLET | Freq: Once | ORAL | Status: AC
  Administered 2015-12-07: 40 mg via ORAL
  Filled 2015-12-07: qty 2

## 2015-12-07 MED ORDER — TRAMADOL HCL 50 MG PO TABS
50.0000 mg | ORAL_TABLET | Freq: Once | ORAL | Status: AC
  Administered 2015-12-07: 50 mg via ORAL
  Filled 2015-12-07: qty 1

## 2015-12-07 MED ORDER — TRAMADOL HCL 50 MG PO TABS: 50.0000 mg | ORAL_TABLET | Freq: Four times a day (QID) | ORAL | 0 refills | Status: DC | PRN

## 2015-12-07 MED ORDER — PREDNISONE 20 MG PO TABS: 60.0000 mg | ORAL_TABLET | Freq: Every day | ORAL | 0 refills | Status: AC

## 2015-12-07 NOTE — ED Provider Notes (Signed)
[facility] Emergency Department Provider Note  ____________________________________________  Time seen: Approximately 7:18 AM  I have reviewed the triage vital signs and the nursing notes.   HISTORY  Chief Complaint Back Pain   HPI Walter Pierce is a 45 y.o. male with a history of chronic back pain who presents for exacerbation of his chronic back pain. Patient reports that he has had pain for many years. Usually the pain is brought on by changes in weather when it's cold. Started feeling pain yesterday and this morning said the pain was markedly worse. Complaining of cramping, 10 out of 10 pain located in the lower lumbar region, radiating down his left leg. He denies saddle anesthesia, numbness or weakness of his lower extremities, trauma to his back, fever or chills, weight loss, history of cancer, urinary or bowel incontinence or retention, abdominal pain. Patient reports the pain is worse when he sits down and better when he walks around. He hasn't tried anything at home for the pain. Patient reports in the past prednisone, Norco, and NSAIDs have helped. 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    Past Medical History:  Diagnosis Date  . Coronary artery disease     Patient Active Problem List   Diagnosis Date Noted  . Migraine headache without aura 12/23/2014  . 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 Left hip pain 12/06/2014  . Morbid obesity (HCC) 12/06/2014  . Family history of diabetes mellitus (DM) 12/06/2014    Past Surgical History:  Procedure Laterality Date  . CORONARY ANGIOPLASTY    . CORONARY ANGIOPLASTY WITH STENT PLACEMENT  2010  . heart cath      Prior to Admission medications   Medication Sig Start Date End Date Taking? Authorizing Provider  HYDROcodone-acetaminophen (NORCO) 5-325 MG tablet Take 1-2 tablets by mouth every 4 (four) hours as needed for moderate pain. 04/25/15   Charmayne Sheer Beers,  PA-C  ibuprofen (ADVIL,MOTRIN) 800 MG tablet Take 1 tablet (800 mg total) by mouth every 8 (eight) hours. 12/07/15 12/12/15  Nita Sickle, MD  methocarbamol (ROBAXIN) 500 MG tablet Take 1 tablet (500 mg total) by mouth every 6 (six) hours as needed for muscle spasms. 04/25/15   Evangeline Dakin, PA-C  naproxen (NAPROSYN) 500 MG tablet Take 1 tablet (500 mg total) by mouth 2 (two) times daily with a meal. 04/25/15   Charmayne Sheer Beers, PA-C  predniSONE (DELTASONE) 20 MG tablet Take 3 tablets (60 mg total) by mouth daily. 12/07/15 12/11/15  Nita Sickle, MD  traMADol (ULTRAM) 50 MG tablet Take 1 tablet (50 mg total) by mouth every 6 (six) hours as needed for moderate pain or severe pain. 12/07/15 12/06/16  Nita Sickle, MD    Allergies Review of patient's allergies indicates no known allergies.  Family History  Problem Relation Age of Onset  . Diabetes Mother   . Arthritis Mother   . Hypertension Mother   . Drug abuse Mother   . Heart attack Father   . Diabetes Maternal Grandmother   . Diabetes Maternal Grandfather   . Diabetes Paternal Grandmother   . Hypertension Sister   . Diabetes Daughter   . Hypertension Sister     Social History Social History  Substance Use Topics  . Smoking status: Current Every Day Smoker    Packs/day: 1.00    Types: Cigarettes  . Smokeless tobacco: Never Used  . Alcohol use No    Review of Systems  Constitutional: Negative for fever. Eyes: Negative for visual changes. ENT: Negative for sore throat. Cardiovascular: Negative for chest pain. Respiratory: Negative for shortness of breath. Gastrointestinal: Negative for abdominal pain, vomiting or diarrhea. Genitourinary: Negative for dysuria. Musculoskeletal: + back pain. Skin: Negative for rash. Neurological: Negative for headaches, weakness or numbness.  ____________________________________________   PHYSICAL EXAM:  VITAL SIGNS: ED Triage Vitals  Enc Vitals Group     BP 12/07/15 0629  135/86     Pulse Rate 12/07/15 0629 82     Resp --      Temp 12/07/15 0629 97.8 F (36.6 C)     Temp Source 12/07/15 0629 Oral     SpO2 12/07/15 0629 98 %     Weight 12/07/15 0625 (!) 325 lb (147.4 kg)     Height 12/07/15 0625 5\' 6"  (1.676 m)     Head Circumference --      Peak Flow --      Pain Score 12/07/15 0625 9     Pain Loc --      Pain Edu? --      Excl. in GC? --     Constitutional: Alert and oriented. Well appearing and in no apparent distress. HEENT:      Head: Normocephalic and atraumatic.         Eyes: Conjunctivae are normal. Sclera is non-icteric. EOMI. PERRL      Mouth/Throat: Mucous membranes are moist.       Neck: Supple with no signs of meningismus. Cardiovascular: Regular rate and rhythm. No murmurs, gallops, or rubs. 2+ symmetrical distal pulses are present in all extremities. No JVD. Respiratory: Normal respiratory effort. Lungs are clear to auscultation bilaterally. No wheezes, crackles, or rhonchi.  Gastrointestinal: Soft, non tender, and non distended with positive bowel sounds. No rebound or guarding. Musculoskeletal: Paraspinal tenderness to palpation in the lumbar spine, no midline tenderness to palpation on CT and L-spine. Nontender with normal range of motion in all extremities. No edema, cyanosis, or erythema of extremities. Neurologic: A & O x3, PERRL, no nystagmus, CN II-XII intact, motor testing reveals good tone and bulk throughout. There is no evidence of pronator drift or dysmetria. Muscle strength is 5/5 throughout. Deep tendon reflexes are 2+ throughout with downgoing toes. Sensory examination is intact. Gait is normal. Skin: Skin is warm, dry and intact. No rash noted. Psychiatric: Mood and affect are normal. Speech and behavior are normal.  ____________________________________________   LABS (all labs ordered are listed, but only abnormal results are displayed)  Labs Reviewed - No data to  display ____________________________________________  EKG  none ____________________________________________  RADIOLOGY  Lumbar spine XR: Negative for acute injury  ____________________________________________   PROCEDURES  Procedure(s) performed: None Procedures Critical Care performed:  None ____________________________________________   INITIAL IMPRESSION / ASSESSMENT AND PLAN / ED COURSE  45 y.o. male with a history of chronic back pain who presents for exacerbation of his chronic back pain. Patient reports that he has had pain for many years. Usually the pain is brought on by changes in weather when it's cold. Started feeling pain yes  History overall reassuring with the absence of red flags including:  - Age < 2218 and > 45 years old - Rapid progression of symptoms - Traumatic event, even minor trauma - Systemic symptoms including fevers, chills, weight loss - Recent bacterial infection - Unremitting pain or pain at night with rest - Numbness, weakness, difficulty walking, urinary retention or bowel incontinence - Personal history of cancer, immunosuppression, diabetes,  known AAA, or renal colic - Personal history of IV drug use.    Exam overall reassuring with symmetric and intact lower extremity strength, sensation, and reflexes without clonus, 2+ symmetric medial malleolar and dorsalis pedis pulses, no ttp over vertebral bodies, no pulsatile abdominal mass. Patient tender to palpation over the lumbar paraspinal regions.  Based on history and physical, I have a very low suspicion of a concerning etiology of pain including epidural compression syndrome, spinal infection, transverse myelitis, malignancy, abdominal aortic aneurysm, renal colic, acute lower extremity claudication, neurogenic claudication, ankylosing spondylitis, or other intra-abdominal process.  Will start patient on tramadol, high-dose NSAID, and prednisone.   Clinical Course  Comment By Time  XR  negative for acute findings. Will dc home on high dose NSAIDs, prednisone, tramadol, and f/u with PMD. Nita Sickle, MD 08/30 [phone]    Pertinent labs & imaging results that were available during my care of the patient were reviewed by me and considered in my medical decision making (see chart for details).    ____________________________________________   FINAL CLINICAL IMPRESSION(S) / ED DIAGNOSES  Final diagnoses:  Bilateral low back pain without sciatica      NEW MEDICATIONS STARTED DURING THIS VISIT:  New Prescriptions   IBUPROFEN (ADVIL,MOTRIN) 800 MG TABLET    Take 1 tablet (800 mg total) by mouth every 8 (eight) hours.   PREDNISONE (DELTASONE) 20 MG TABLET    Take 3 tablets (60 mg total) by mouth daily.   TRAMADOL (ULTRAM) 50 MG TABLET    Take 1 tablet (50 mg total) by mouth every 6 (six) hours as needed for moderate pain or severe pain.     Note:  This document was prepared using Dragon voice recognition software and may include unintentional dictation errors.     Nita Sickle, MD 12/07/15 [phone]

## 2015-12-07 NOTE — Discharge Instructions (Signed)
You have been seen in the Emergency Department (ED)  today for back pain.  Back pain has many possible causes some are related to muscles while others have more serious causes. Even though you were checked carefully today and your exam and evaluation were reassuring, problems may develop later or continue to unfold. Therefore it is imperative that you follow up with doctor closely for further evaluation.  Follow-up with your doctor in 1 day for further evaluation.  For pain control take: 800mg  of ibuprofen every 8 hours with meals. 50mg  of tramadol every 6 hours as needed. Prednisone 60mg  a day for 4 days.  When should you call for help?  Call your doctor now or seek immediate medical care if:  You have new or worsening numbness in your legs.  You have new or worsening weakness in your legs. (This could make it hard to stand up.)  You lose control of your bladder or bowels or if you are unable to urinate. You have numbness of your groin or buttock region If you develop a fever  Watch closely for changes in your health, and be sure to contact your doctor if:  Your pain gets worse.  You are not getting better after 2 weeks.  How can you care for yourself at home?  Take pain medicines exactly as directed.  If the doctor gave you a prescription medicine for pain, take it as prescribed.  If you are not taking a prescription pain medicine, ask your doctor if you can take an over-the-counter medicine like tylenol or ibuprofen. Sit or lie in positions that are most comfortable and reduce your pain. Try one of these positions when you lie down:  Lie on your back with your knees bent and supported by large pillows.  Lie on the floor with your legs on the seat of a sofa or chair.  Lie on your side with your knees and hips bent and a pillow between your legs.  Lie on your stomach if it does not make pain worse. Do not sit up in bed, and avoid soft couches and twisted positions. Bed rest can help  relieve pain at first, but it delays healing. Avoid bed rest after the first day of back pain.  Change positions every 30 minutes. If you must sit for long periods of time, take breaks from sitting. Get up and walk around, or lie in a comfortable position.  Try using a heating pad on a low or medium setting for 15 to 20 minutes every 2 or 3 hours. Try a warm shower in place of one session with the heating pad.  You can also try an ice pack for 10 to 15 minutes every 2 to 3 hours. Put a thin cloth between the ice pack and your skin.  Take short walks several times a day. You can start with 5 to 10 minutes, 3 or 4 times a day, and work up to longer walks. Walk on level surfaces and avoid hills and stairs until your back is better.  Return to work and other activities as soon as you can. Continued rest without activity is usually not good for your back.  To prevent future back pain, do exercises to stretch and strengthen your back and stomach. Learn how to use good posture, safe lifting techniques, and proper body mechanics.

## 2015-12-07 NOTE — ED Notes (Signed)
After assessment pt requested interpreter. Informed that next time someone came to talk to him there would be an interpreter.

## 2015-12-07 NOTE — ED Triage Notes (Signed)
Patient ambulatory to triage with steady gait, without difficulty or distress noted; pt reports lower back pain upon awakening; st hx of same; denies radiating pain

## 2015-12-07 NOTE — ED Notes (Addendum)
Pt states lower back pain. States "when it gets cold my back hurts". States he woke up with it hurting this morning. States the cold bothering his back has been going on "maybe 2 year". Pt states at 0530 he took 2 advil.

## 2016-06-03 ENCOUNTER — Encounter: Payer: Self-pay | Admitting: Emergency Medicine

## 2016-06-03 ENCOUNTER — Emergency Department
Admission: EM | Admit: 2016-06-03 | Discharge: 2016-06-03 | Disposition: A | Discharge status: Home / Self Care | Payer: BLUE CROSS/BLUE SHIELD | Attending: Emergency Medicine | Admitting: Emergency Medicine

## 2016-06-03 DIAGNOSIS — M545 Low back pain, unspecified: Secondary | ICD-10-CM

## 2016-06-03 DIAGNOSIS — I2510 Atherosclerotic heart disease of native coronary artery without angina pectoris: Secondary | ICD-10-CM | POA: Diagnosis not present

## 2016-06-03 DIAGNOSIS — Z79899 Other long term (current) drug therapy: Secondary | ICD-10-CM | POA: Diagnosis not present

## 2016-06-03 DIAGNOSIS — F17210 Nicotine dependence, cigarettes, uncomplicated: Secondary | ICD-10-CM | POA: Insufficient documentation

## 2016-06-03 MED ORDER — TRAMADOL HCL 50 MG PO TABS: 50.0000 mg | ORAL_TABLET | Freq: Four times a day (QID) | ORAL | 0 refills | Status: AC | PRN

## 2016-06-03 MED ORDER — OXYCODONE-ACETAMINOPHEN 5-325 MG PO TABS
2.0000 | ORAL_TABLET | Freq: Once | ORAL | Status: AC
  Administered 2016-06-03: 2 via ORAL
  Filled 2016-06-03: qty 2

## 2016-06-03 MED ORDER — PREDNISONE 20 MG PO TABS: 40.0000 mg | ORAL_TABLET | Freq: Every day | ORAL | 0 refills | Status: DC

## 2016-06-03 NOTE — ED Notes (Signed)
Patient states he has called his daughter to come pick him up. Will let this RN know when she arrives.

## 2016-06-03 NOTE — ED Notes (Signed)
After having interpreter on a stick explain to pt why he cannot driver until after 7am, pt has been moved to recliner in [place] to sleep until he is able to drive

## 2016-06-03 NOTE — ED Triage Notes (Signed)
Patient states that he was helping a friend move yesterday and woke up today with lower back pain.

## 2016-06-03 NOTE — ED Provider Notes (Signed)
[facility] Emergency Department Provider Note  Time seen: 2:12 AM  I have reviewed the triage vital signs and the nursing notes.   HISTORY  Chief Complaint Back Pain    HPI Walter Pierce is a 46 y.o. male who presents to the emergency department with lower back pain. According to the patient he was helping a friend move yesterday. States a lot of heavy lifting. Today he states he woke and he has been having progressively worsening pain in his lower back. No weakness or numbness. No shooting pains.  Past Medical History:  Diagnosis Date  . Coronary artery disease     Patient Active Problem List   Diagnosis Date Noted  . Migraine headache without aura 12/23/2014  . 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 Left hip pain 12/06/2014  . Morbid obesity (HCC) 12/06/2014  . Family history of diabetes mellitus (DM) 12/06/2014    Past Surgical History:  Procedure Laterality Date  . CORONARY ANGIOPLASTY    . CORONARY ANGIOPLASTY WITH STENT PLACEMENT  2010  . heart cath      Prior to Admission medications   Medication Sig Start Date End Date Taking? Authorizing Provider  HYDROcodone-acetaminophen (NORCO) 5-325 MG tablet Take 1-2 tablets by mouth every 4 (four) hours as needed for moderate pain. 04/25/15   Charmayne Sheerharles M Beers, PA-C  methocarbamol (ROBAXIN) 500 MG tablet Take 1 tablet (500 mg total) by mouth every 6 (six) hours as needed for muscle spasms. 04/25/15   Evangeline Dakinharles M Beers, PA-C  naproxen (NAPROSYN) 500 MG tablet Take 1 tablet (500 mg total) by mouth 2 (two) times daily with a meal. 04/25/15   Evangeline Dakinharles M Beers, PA-C  traMADol (ULTRAM) 50 MG tablet Take 1 tablet (50 mg total) by mouth every 6 (six) hours as needed for moderate pain or severe pain. 12/07/15 12/06/16  Nita Sicklearolina Veronese, MD    No Known Allergies  Family History  Problem Relation Age of Onset  . Diabetes Mother   . Arthritis Mother   . Hypertension Mother   . Drug abuse Mother   . Heart  attack Father   . Diabetes Maternal Grandmother   . Diabetes Maternal Grandfather   . Diabetes Paternal Grandmother   . Hypertension Sister   . Diabetes Daughter   . Hypertension Sister     Social History Social History  Substance Use Topics  . Smoking status: Current Every Day Smoker    Packs/day: 1.00    Types: Cigarettes  . Smokeless tobacco: Never Used  . Alcohol use Yes     Comment: occ    Review of Systems Constitutional: Negative for fever Cardiovascular: Negative for chest pain. Respiratory: Negative for shortness of breath. Gastrointestinal: Negative for abdominal pain Neurological: Negative for headache 10-point ROS otherwise negative.  ____________________________________________   PHYSICAL EXAM:  VITAL SIGNS: ED Triage Vitals  Enc Vitals Group     BP 06/03/16 0025 136/78     Pulse Rate 06/03/16 0025 92     Resp 06/03/16 0025 18     Temp 06/03/16 0025 98.4 F (36.9 C)     Temp Source 06/03/16 0025 Oral     SpO2 06/03/16 0025 98 %     Weight 06/03/16 0024 (!) 325 lb (147.4 kg)     Height 06/03/16 0024 5\' 6"  (1.676 m)     Head Circumference --      Peak Flow --      Pain Score 06/03/16 0024 10  Pain Loc --      Pain Edu? --      Excl. in GC? --     Constitutional: Alert and oriented. Well appearing and in no distress. Eyes: Normal exam ENT   Head: Normocephalic and atraumatic   Mouth/Throat: Mucous membranes are moist. Cardiovascular: Normal rate, regular rhythm. No murmur Respiratory: Normal respiratory effort without tachypnea nor retractions. Breath sounds are clear  Gastrointestinal: Soft and nontender. No distention.  Musculoskeletal: Moderate lower lumbar tenderness palpation. Neurologic:  Normal speech and language. No gross focal neurologic deficits  Skin:  Skin is warm, dry and intact.  Psychiatric: Mood and affect are normal.  ____________________________________________   INITIAL IMPRESSION / ASSESSMENT AND PLAN / ED  COURSE  Pertinent labs & imaging results that were available during my care of the patient were reviewed by me and considered in my medical decision making (see chart for details).  Patient presents to the emergency department with lower back pain. Patient states he was helping his friend move yesterday doing a lot of heavy lifting. Since awakening this morning he has been having pain in his back which has gotten worse throughout the course of the day. Denies any falls or trauma. Overall the patient appears well, moderate lower lumbar tenderness to palpation.  ____________________________________________   FINAL CLINICAL IMPRESSION(S) / ED DIAGNOSES  Lumbar strain    Minna Antis, MD 06/03/16 [phone]

## 2016-09-13 ENCOUNTER — Emergency Department
Admission: EM | Admit: 2016-09-13 | Discharge: 2016-09-13 | Disposition: A | Discharge status: Home / Self Care | Payer: BLUE CROSS/BLUE SHIELD | Attending: Emergency Medicine | Admitting: Emergency Medicine

## 2016-09-13 DIAGNOSIS — Z79899 Other long term (current) drug therapy: Secondary | ICD-10-CM | POA: Diagnosis not present

## 2016-09-13 DIAGNOSIS — R51 Headache: Secondary | ICD-10-CM | POA: Diagnosis present

## 2016-09-13 DIAGNOSIS — F17210 Nicotine dependence, cigarettes, uncomplicated: Secondary | ICD-10-CM | POA: Diagnosis not present

## 2016-09-13 DIAGNOSIS — G43909 Migraine, unspecified, not intractable, without status migrainosus: Secondary | ICD-10-CM | POA: Diagnosis not present

## 2016-09-13 DIAGNOSIS — I2510 Atherosclerotic heart disease of native coronary artery without angina pectoris: Secondary | ICD-10-CM | POA: Insufficient documentation

## 2016-09-13 LAB — CBC WITH DIFFERENTIAL/PLATELET
Basophils Absolute: 0 10*3/uL (ref 0–0.1)
Basophils Relative: 1 %
Eosinophils Absolute: 0.1 10*3/uL (ref 0–0.7)
Eosinophils Relative: 2 %
HCT: 50.3 % (ref 40.0–52.0)
Hemoglobin: 17 g/dL (ref 13.0–18.0)
Lymphocytes Relative: 37 %
Lymphs Abs: 2 10*3/uL (ref 1.0–3.6)
MCH: 32 pg (ref 26.0–34.0)
MCHC: 33.9 g/dL (ref 32.0–36.0)
MCV: 94.6 fL (ref 80.0–100.0)
Monocytes Absolute: 0.4 10*3/uL (ref 0.2–1.0)
Monocytes Relative: 8 %
Neutro Abs: 2.8 10*3/uL (ref 1.4–6.5)
Neutrophils Relative %: 52 %
Platelets: 159 10*3/uL (ref 150–440)
RBC: 5.32 MIL/uL (ref 4.40–5.90)
RDW: 14.1 % (ref 11.5–14.5)
WBC: 5.3 10*3/uL (ref 3.8–10.6)

## 2016-09-13 LAB — BASIC METABOLIC PANEL
Anion gap: 4 — ABNORMAL LOW (ref 5–15)
BUN: 15 mg/dL (ref 6–20)
CO2: 27 mmol/L (ref 22–32)
Calcium: 8.5 mg/dL — ABNORMAL LOW (ref 8.9–10.3)
Chloride: 103 mmol/L (ref 101–111)
Creatinine, Ser: 0.79 mg/dL (ref 0.61–1.24)
GFR calc Af Amer: >60 mL/min (ref >60)
GFR calc non Af Amer: >60 mL/min (ref >60)
Glucose, Bld: 92 mg/dL (ref 65–99)
Potassium: 4.7 mmol/L (ref 3.5–5.1)
Sodium: 134 mmol/L — ABNORMAL LOW (ref 135–145)

## 2016-09-13 MED ORDER — ACETAMINOPHEN 500 MG PO TABS
1000.0000 mg | ORAL_TABLET | Freq: Once | ORAL | Status: AC
  Administered 2016-09-13: 1000 mg via ORAL
  Filled 2016-09-13: qty 2

## 2016-09-13 MED ORDER — KETOROLAC TROMETHAMINE 30 MG/ML IJ SOLN
15.0000 mg | Freq: Once | INTRAMUSCULAR | Status: AC
  Administered 2016-09-13: 15 mg via INTRAVENOUS
  Filled 2016-09-13: qty 1

## 2016-09-13 MED ORDER — SODIUM CHLORIDE 0.9 % IV BOLUS (SEPSIS)
500.0000 mL | INTRAVENOUS | Status: AC
  Administered 2016-09-13: 500 mL via INTRAVENOUS

## 2016-09-13 MED ORDER — DEXAMETHASONE SODIUM PHOSPHATE 10 MG/ML IJ SOLN
10.0000 mg | Freq: Once | INTRAMUSCULAR | Status: AC
  Administered 2016-09-13: 10 mg via INTRAVENOUS
  Filled 2016-09-13: qty 1

## 2016-09-13 MED ORDER — DIPHENHYDRAMINE HCL 50 MG/ML IJ SOLN
12.5000 mg | Freq: Once | INTRAMUSCULAR | Status: AC
  Administered 2016-09-13: 12.5 mg via INTRAVENOUS
  Filled 2016-09-13: qty 1

## 2016-09-13 MED ORDER — METOCLOPRAMIDE HCL 5 MG/ML IJ SOLN
10.0000 mg | INTRAMUSCULAR | Status: AC
  Administered 2016-09-13: 10 mg via INTRAVENOUS
  Filled 2016-09-13: qty 2

## 2016-09-13 NOTE — Discharge Instructions (Signed)
You have been seen in the Emergency Department (ED) for a migraine.  Please use Tylenol or Motrin as needed for symptoms, but only as written on the box, and take any regular medications that have been prescribed for you. As we have discussed, please follow up with your doctor as soon as possible regarding today?s ED visit and your headache symptoms.    As we discussed, it is very important that you go back to your prior primary care doctor, or establish a new one.  Please call the number provided [phone]) to establish a new doctor.  Call your doctor or return to the Emergency Department (ED) if you have a worsening headache, sudden and severe headache, confusion, slurred speech, facial droop, weakness or numbness in any arm or leg, extreme fatigue, or other symptoms that concern you.

## 2016-09-13 NOTE — ED Provider Notes (Signed)
[facility] Emergency Department Provider Note  ____________________________________________   First MD Initiated Contact with Patient 09/13/16 0730     (approximate)  I have reviewed the triage vital signs and the nursing notes.   HISTORY  Chief Complaint Headache and Blurred Vision   The patient and/or family speak(s) Spanish primarily but also speaks [place] well.  He understands he has the right to the use of a hospital interpreter, however at this time he prefers to speak directly with me in [place] and [place].  He knows he can ask for an interpreter at any time.   HPI Walter Pierce is a 46 y.o. male with a history of headaches, possibly migraines, and whose other medical history as listed below presents for evaluation of gradual onset headache over the last 2 days.  He states it is mostly in the front, dull and aching, and ibuprofen has not been helping.  Sometimes he feels like it radiates down the back of his head as well but it does not radiate anywhere else.  It is accompanied with nausea but he has not had any vomiting.  Bright lights seem to make it worse and sometimes he feels dizzy.  Initially there was a report that he was describing blurry vision but he clarified with me in Spanish that he was referring to a dizzy sensation although initially he did describe his vision is seeming somewhat cloudy.    Nothing in particular makes the patient's symptoms better nor worse.    He is otherwise well-appearing and denies fever/chills, chest pain, shortness of breath, vomiting, abdominal pain, dysuria.  Past Medical History:  Diagnosis Date  . Coronary artery disease     Patient Active Problem List   Diagnosis Date Noted  . Migraine headache without aura 12/23/2014  . 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 Left hip pain 12/06/2014  . Morbid obesity (HCC) 12/06/2014  . Family history of diabetes mellitus (DM) 12/06/2014    Past Surgical  History:  Procedure Laterality Date  . CORONARY ANGIOPLASTY    . CORONARY ANGIOPLASTY WITH STENT PLACEMENT  2010  . heart cath      Prior to Admission medications   Medication Sig Start Date End Date Taking? Authorizing Provider  HYDROcodone-acetaminophen (NORCO) 5-325 MG tablet Take 1-2 tablets by mouth every 4 (four) hours as needed for moderate pain. 04/25/15   Beers, Charmayne Sheer, PA-C  methocarbamol (ROBAXIN) 500 MG tablet Take 1 tablet (500 mg total) by mouth every 6 (six) hours as needed for muscle spasms. 04/25/15   Beers, Charmayne Sheer, PA-C  naproxen (NAPROSYN) 500 MG tablet Take 1 tablet (500 mg total) by mouth 2 (two) times daily with a meal. 04/25/15   Beers, Charmayne Sheer, PA-C  predniSONE (DELTASONE) 20 MG tablet Take 2 tablets (40 mg total) by mouth daily. 06/03/16   Minna Antis, MD  traMADol (ULTRAM) 50 MG tablet Take 1 tablet (50 mg total) by mouth every 6 (six) hours as needed. 06/03/16 06/03/17  Minna Antis, MD    Allergies Patient has no known allergies.  Family History  Problem Relation Age of Onset  . Diabetes Mother   . Arthritis Mother   . Hypertension Mother   . Drug abuse Mother   . Heart attack Father   . Diabetes Maternal Grandmother   . Diabetes Maternal Grandfather   . Diabetes Paternal Grandmother   . Hypertension Sister   . Diabetes Daughter   . Hypertension Sister  Social History Social History  Substance Use Topics  . Smoking status: Current Every Day Smoker    Packs/day: 1.00    Types: Cigarettes  . Smokeless tobacco: Never Used  . Alcohol use Yes     Comment: occ    Review of Systems Constitutional: No fever/chills Eyes: Possible mild blurry vision, +photophobia ENT: No sore throat. Cardiovascular: Denies chest pain. Respiratory: Denies shortness of breath. Gastrointestinal: No abdominal pain.  Nausea, no vomiting.  No diarrhea.  No constipation. Genitourinary: Negative for dysuria. Musculoskeletal: Negative for neck pain.   Negative for back pain. Integumentary: Negative for rash. Neurological: Gradual onset severe headache, mostly frontal.  Generalized weakness but no focal weakness nor numbness.   ____________________________________________   PHYSICAL EXAM:  VITAL SIGNS: ED Triage Vitals  Enc Vitals Group     BP 09/13/16 0627 (!) 152/102     Pulse Rate 09/13/16 0627 81     Resp 09/13/16 0627 16     Temp 09/13/16 0627 98.6 F (37 C)     Temp Source 09/13/16 0627 Oral     SpO2 09/13/16 0627 100 %     Weight 09/13/16 0624 (!) 152 kg (335 lb)     Height 09/13/16 0624 1.676 m (5\' 6" )     Head Circumference --      Peak Flow --      Pain Score 09/13/16 0624 9     Pain Loc --      Pain Edu? --      Excl. in GC? --     Constitutional: Alert and oriented. Well appearing and in no acute distress. Eyes: Conjunctivae are normal. PERRL. EOMI.  Normal funduscopic exam without clear evidence of papilledema although the patient is having a hard time keeping his eyes still when I am trying to look into them in order to get a good look at the vessels Head: Atraumatic. Nose: No congestion/rhinnorhea. Mouth/Throat: Mucous membranes are moist. Neck: No stridor.  No meningeal signs.   Cardiovascular: Normal rate, regular rhythm. Good peripheral circulation. Grossly normal heart sounds. Respiratory: Normal respiratory effort.  No retractions. Lungs CTAB. Gastrointestinal: Soft and nontender. No distention.  Musculoskeletal: No lower extremity tenderness nor edema. No gross deformities of extremities. Neurologic:  Normal speech and language. No gross focal neurologic deficits are appreciated.  Skin:  Skin is warm, dry and intact. No rash noted. Psychiatric: Mood and affect are normal. Speech and behavior are normal.  ____________________________________________   LABS (all labs ordered are listed, but only abnormal results are displayed)  Labs Reviewed  BASIC METABOLIC PANEL - Abnormal; Notable for the  following:       Result Value   Sodium 134 (*)    Calcium 8.5 (*)    Anion gap 4 (*)    All other components within normal limits  CBC WITH DIFFERENTIAL/PLATELET   ____________________________________________  EKG  None - EKG not ordered by ED physician ____________________________________________  RADIOLOGY   No results found.  ____________________________________________   PROCEDURES  Critical Care performed: No   Procedure(s) performed:   Procedures   ____________________________________________   INITIAL IMPRESSION / ASSESSMENT AND PLAN / ED COURSE  Pertinent labs & imaging results that were available during my care of the patient were reviewed by me and considered in my medical decision making (see chart for details).  The patient's signs and symptoms are most consistent with migraine headache.  He reports that his mother has a history of migraines as well and he has migraine  headache listed in his past problem list.  His blood pressure was elevated but it is difficult to say whether this is the cause of the headache or vice versa.  He knows that he has hypertension as well but does not take any medicines because he does not have a primary care doctor Olevia Perches(Megan Johnson as listed in the computer but apparently he does not go there anymore).  I discussed with the patient the plan to treat empirically for migraine and I do not feel we need imaging at this time.  He has listed in his medical record that he previously tested positive for HIV he is having no signs or symptoms of infectious illness at this point.  We started IV to treat for the migraine I will check a CBC and BMP, and I we will reassess after medications, but I anticipate he will feel better and be appropriate for outpatient follow-up.  I told him I would provide the number for the patient navigator so that he can establish a new primary care provider and he agrees with this plan.   Clinical Course as of Sep 14 1130  Thu Sep 13, 2016  [id number] Lab work, in particular the CBC, looks good and is reassuring for no acute infectious process.  [CF]  1128 The patient states he feels almost completely better and the headache is essentially gone.  He is pleased and states he is ready to go home.  I reiterated my recommendations to follow-up as an outpatient.  I gave my usual and customary return precautions.     [CF]    Clinical Course User Index [CF] Loleta RoseForbach, Jubal Rademaker, MD    ____________________________________________  FINAL CLINICAL IMPRESSION(S) / ED DIAGNOSES  Final diagnoses:  Migraine without status migrainosus, not intractable, unspecified migraine type     MEDICATIONS GIVEN DURING THIS VISIT:  Medications  ketorolac (TORADOL) 30 MG/ML injection 15 mg (15 mg Intravenous Given 09/13/16 0812)  dexamethasone (DECADRON) injection 10 mg (10 mg Intravenous Given 09/13/16 0813)  metoCLOPramide (REGLAN) injection 10 mg (10 mg Intravenous Given 09/13/16 0813)  diphenhydrAMINE (BENADRYL) injection 12.5 mg (12.5 mg Intravenous Given 09/13/16 [id number])  acetaminophen (TYLENOL) tablet 1,000 mg (1,000 mg Oral Given 09/13/16 0813)  sodium chloride 0.9 % bolus 500 mL (0 mLs Intravenous Stopped 09/13/16 1108)     NEW OUTPATIENT MEDICATIONS STARTED DURING THIS VISIT:  New Prescriptions   No medications on file    Modified Medications   No medications on file    Discontinued Medications   No medications on file     Note:  This document was prepared using Dragon voice recognition software and may include unintentional dictation errors.    Loleta RoseForbach, Riordan Walle, MD 09/13/16 [phone]

## 2016-09-13 NOTE — ED Triage Notes (Addendum)
Pt presents to ED with c/o frontal headache, nausea, and blurry vision since yesterday. Pt reports h/x of headaches and states pain is similar to HAs in the past. No facial droop, no c/o trouble speaking or swallowing, no c/o of extremity weakness or numbness. Reports taking OTC Ibuprofen without relief (last dose taken at 530 am). Pt also reports having his BP checked at work a couple weeks ago and told his BP was "high" but no follow-up was performed.

## 2017-07-20 IMAGING — CR DG LUMBAR SPINE 2-3V
1 series · 3 of 3 positions shown · non-contrast
Comparison: Plain films lumbar spine 07/13/2013 and 05/20/2013.

CLINICAL DATA: Low back pain for 2 days. No known injury. Initial
encounter.

EXAM:
LUMBAR SPINE - 2-3 VIEW

[Series 1: dg lumbar spine 2-3 views · 0.14mm/px · 3 of 3 slices shown]
[im 1/3]
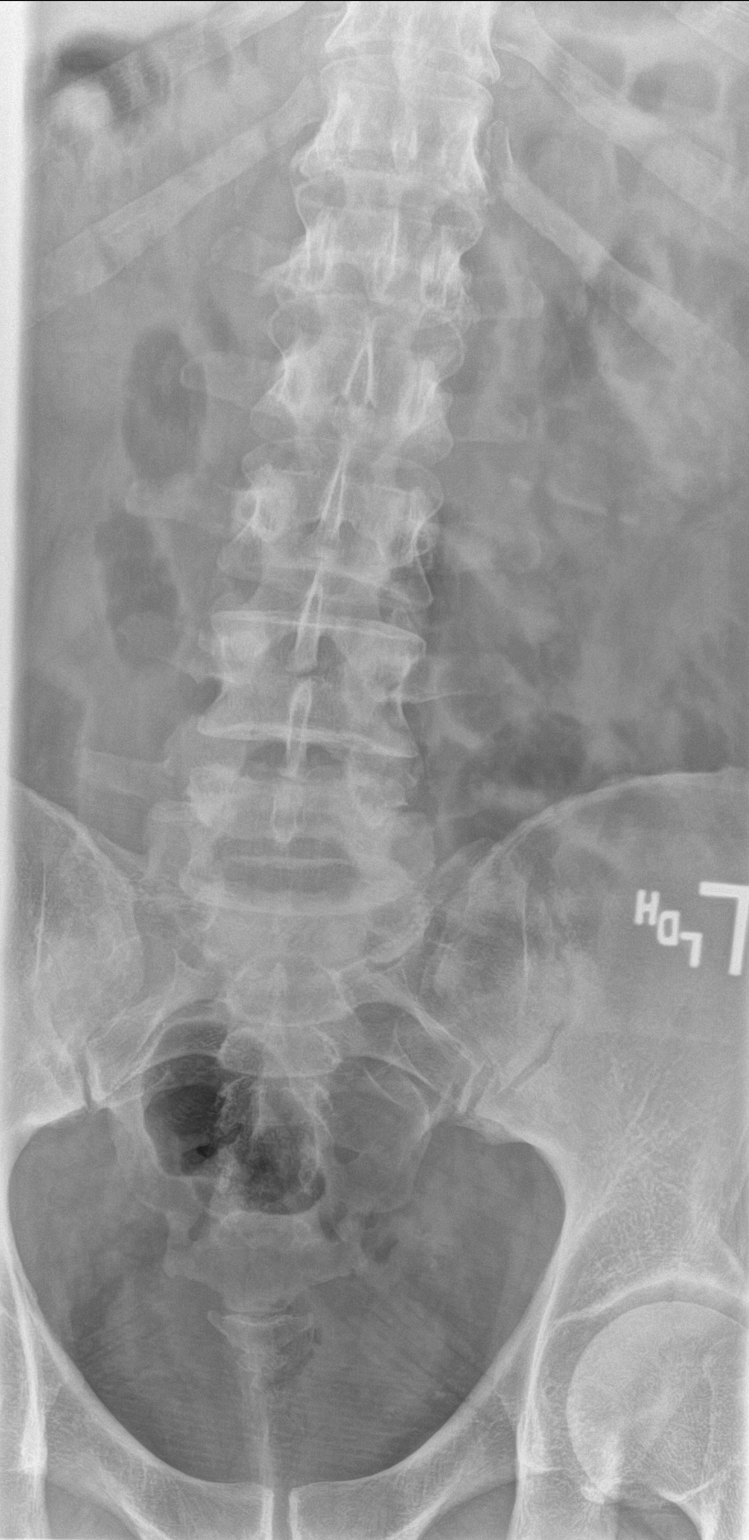
[im 2/3]
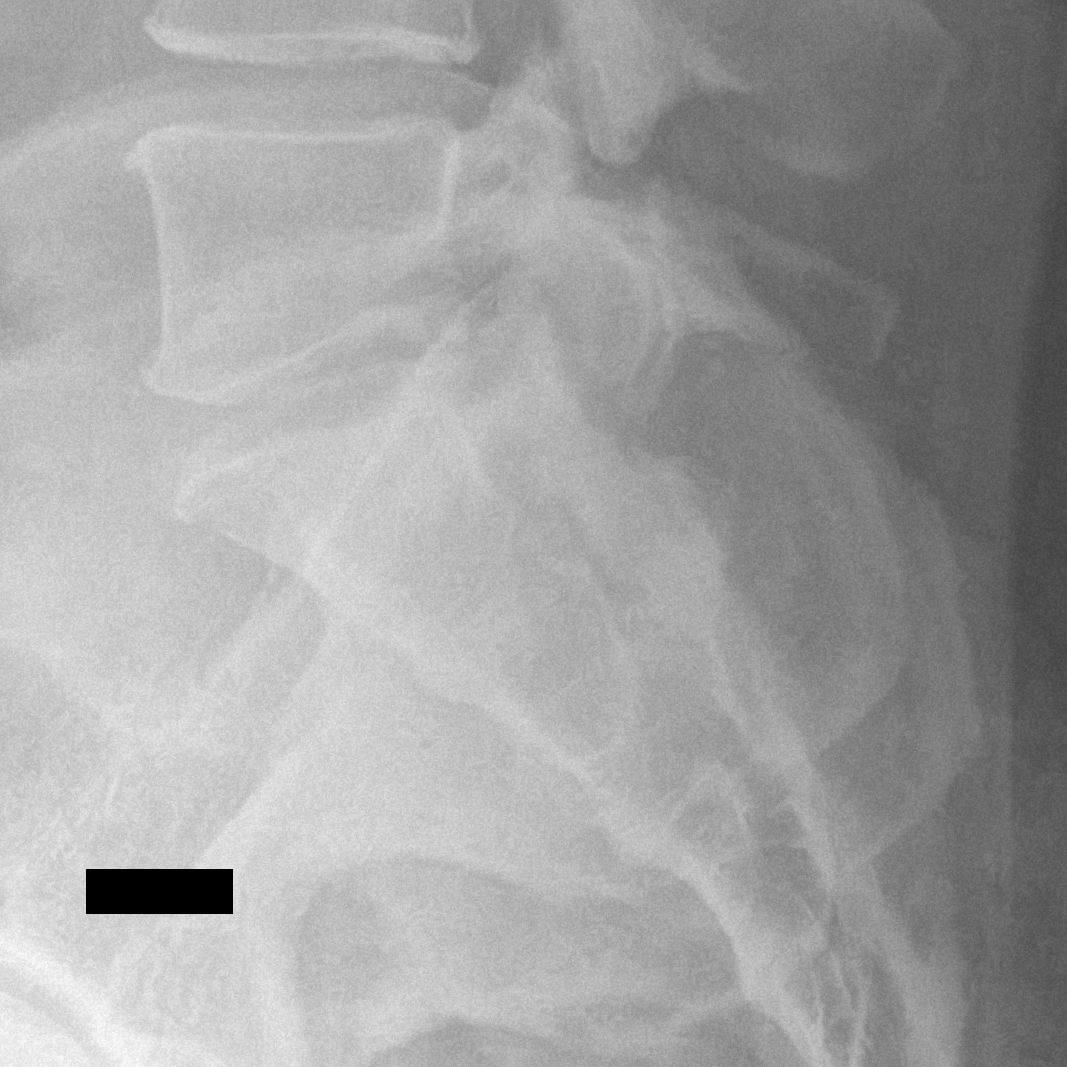
[im 3/3]
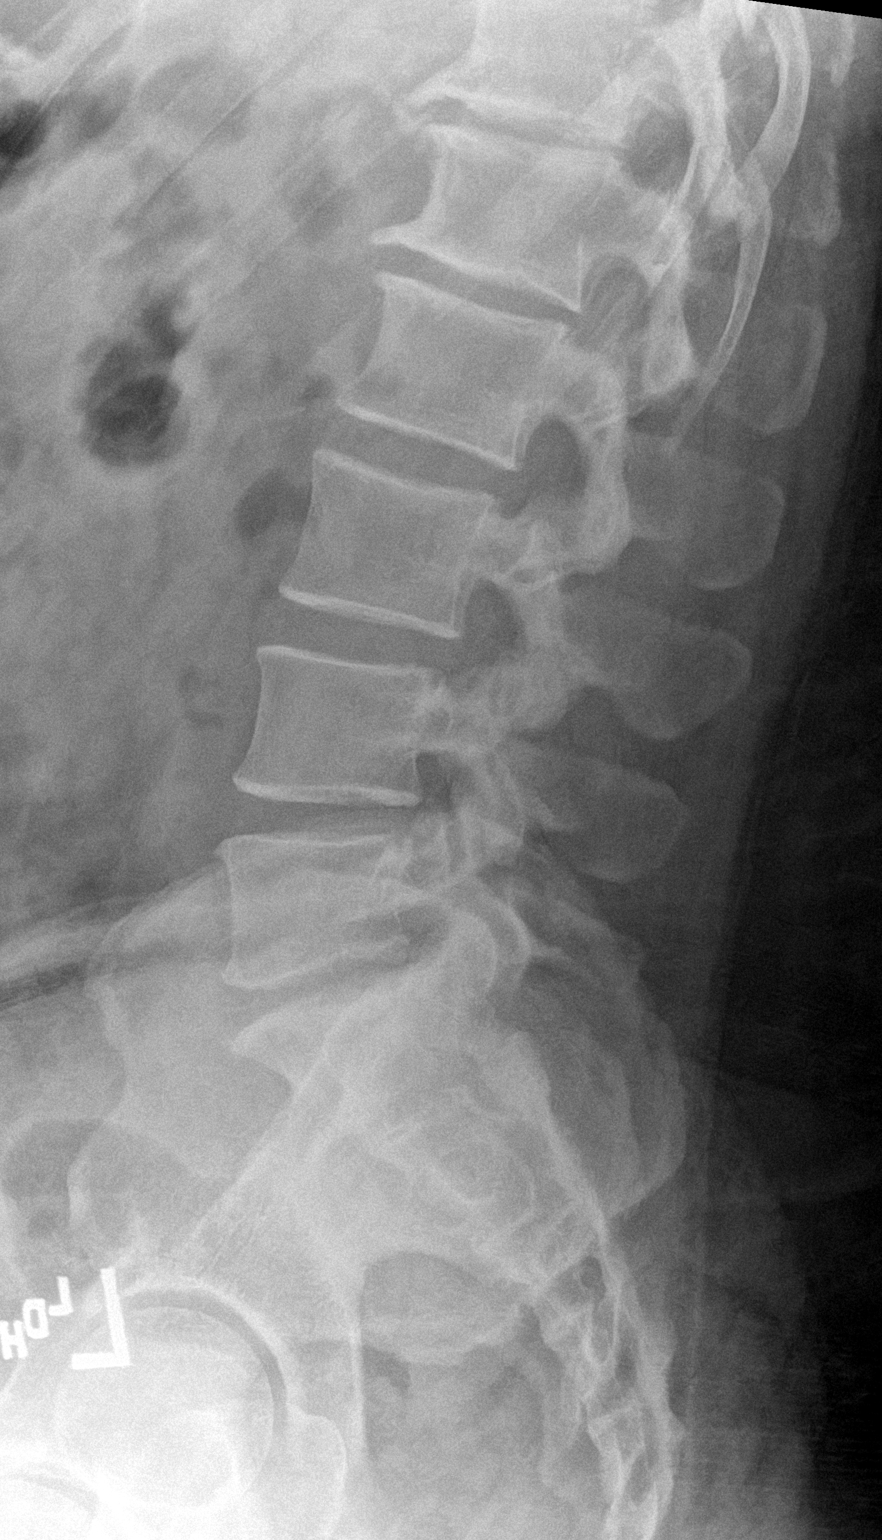

[3 of 3 positions shown; findings below may reference images not displayed]

FINDINGS: Vertebral body height and alignment are maintained. There is some
loss of disc space height and endplate spurring at T11-12 and
T12-L1. Mild loss of disc space height is also seen L5-S1.
Paraspinous soft tissue structures are unremarkable.
IMPRESSION: No acute abnormality. Mild degenerative change is stable in
appearance.

## 2019-11-08 ENCOUNTER — Other Ambulatory Visit: Payer: Self-pay

## 2019-11-08 ENCOUNTER — Emergency Department
Admission: EM | Admit: 2019-11-08 | Discharge: 2019-11-08 | Disposition: A | Discharge status: Home / Self Care | Payer: Self-pay | Attending: Emergency Medicine | Admitting: Emergency Medicine

## 2019-11-08 ENCOUNTER — Emergency Department: Payer: Self-pay

## 2019-11-08 ENCOUNTER — Encounter: Payer: Self-pay | Admitting: Emergency Medicine

## 2019-11-08 DIAGNOSIS — F17210 Nicotine dependence, cigarettes, uncomplicated: Secondary | ICD-10-CM | POA: Insufficient documentation

## 2019-11-08 DIAGNOSIS — I2510 Atherosclerotic heart disease of native coronary artery without angina pectoris: Secondary | ICD-10-CM | POA: Insufficient documentation

## 2019-11-08 DIAGNOSIS — M7592 Shoulder lesion, unspecified, left shoulder: Secondary | ICD-10-CM

## 2019-11-08 DIAGNOSIS — M7582 Other shoulder lesions, left shoulder: Secondary | ICD-10-CM | POA: Insufficient documentation

## 2019-11-08 DIAGNOSIS — Z9861 Coronary angioplasty status: Secondary | ICD-10-CM | POA: Insufficient documentation

## 2019-11-08 DIAGNOSIS — M25512 Pain in left shoulder: Secondary | ICD-10-CM | POA: Insufficient documentation

## 2019-11-08 MED ORDER — DEXAMETHASONE SODIUM PHOSPHATE 10 MG/ML IJ SOLN
10.0000 mg | Freq: Once | INTRAMUSCULAR | Status: AC
  Administered 2019-11-08: 10 mg via INTRAMUSCULAR
  Filled 2019-11-08: qty 1

## 2019-11-08 MED ORDER — PREDNISONE 10 MG PO TABS: 10.0000 mg | ORAL_TABLET | Freq: Every day | ORAL | 0 refills | Status: DC

## 2019-11-08 NOTE — ED Triage Notes (Signed)
Patient with complaint of left shoulder pain that started when he woke up this morning. Patient states that he has taken IBU with no relief. Patient states that it hurts when he moves his shoulder and when he touches it. Triaged using spanish interpreter Lars Mage [phone].

## 2019-11-08 NOTE — Discharge Instructions (Addendum)
You were seen today for left shoulder pain.  Your x-ray is consistent with supraspinatus tendinitis.  You received a steroid injection in the ER.  I provided you with a prescription for steroids to take daily for the next 7 days.  Please avoid NSAIDs OTC such as Advil, ibuprofen, Aleve or Motrin.  You may ice your shoulder for 10 minutes twice daily.  Please follow-up with your PCP if symptoms persist or worsen.

## 2019-11-08 NOTE — ED Provider Notes (Signed)
[facility] Emergency Department Provider Note ____________________________________________  Time seen: 2045  I have reviewed the triage vital signs and the nursing notes.  HISTORY  Chief Complaint  Shoulder Pain   HPI Walter Pierce is a 49 y.o. male presents to the ER today with complaint of left shoulder pain.  He reports he woke up with pain in his left shoulder this morning.  He describes the pain as sharp and stabbing.  The pain radiates down his left arm.  He has noted some weakness but denies numbness or tingling.  His pain is worse with trying to lift his left arm.  He denies any neck or back pain.  He denies any injury to the area.  He denies previous surgery on left shoulder.  He has taken Ibuprofen and Excedrin Migraine OTC with minimal relief of symptoms.  Past Medical History:  Diagnosis Date  . Coronary artery disease     Patient Active Problem List   Diagnosis Date Noted  . Migraine headache without aura 12/23/2014  . 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 Left hip pain 12/06/2014  . Morbid obesity (HCC) 12/06/2014  . Family history of diabetes mellitus (DM) 12/06/2014    Past Surgical History:  Procedure Laterality Date  . CORONARY ANGIOPLASTY    . CORONARY ANGIOPLASTY WITH STENT PLACEMENT  2010  . heart cath      Prior to Admission medications   Medication Sig Start Date End Date Taking? Authorizing Provider  predniSONE (DELTASONE) 10 MG tablet Take 1 tablet (10 mg total) by mouth daily. 11/08/19   Lorre Munroe, NP    Allergies Patient has no known allergies.  Family History  Problem Relation Age of Onset  . Diabetes Mother   . Arthritis Mother   . Hypertension Mother   . Drug abuse Mother   . Heart attack Father   . Diabetes Maternal Grandmother   . Diabetes Maternal Grandfather   . Diabetes Paternal Grandmother   . Hypertension Sister   . Diabetes Daughter   . Hypertension Sister     Social History Social  History   Tobacco Use  . Smoking status: Current Every Day Smoker    Packs/day: 1.00    Types: Cigarettes  . Smokeless tobacco: Never Used  Substance Use Topics  . Alcohol use: Yes    Comment: occ  . Drug use: No    Review of Systems  Constitutional: Negative for fever, chills or body aches. Cardiovascular: Negative for chest pain or chest tightness. Respiratory: Negative for cough or shortness of breath. Gastrointestinal: Negative for abdominal pain, nausea, vomiting, constipation and diarrhea.  Musculoskeletal: Positive for left shoulder pain and weakness.  Negative for neck, back, elbow or wrist pain. Skin: Negative for rash, redness or warmth.. Neurological: Negative for headaches, focal weakness, tingling or numbness. ____________________________________________  PHYSICAL EXAM:  VITAL SIGNS: ED Triage Vitals  Enc Vitals Group     BP 11/08/19 1931 (!) 166/94     Pulse Rate 11/08/19 1931 70     Resp 11/08/19 1931 18     Temp 11/08/19 1931 98.9 F (37.2 C)     Temp Source 11/08/19 1931 Oral     SpO2 11/08/19 1931 98 %     Weight 11/08/19 1932 (!) 355 lb (161 kg)     Height 11/08/19 1932 5\' 6"  (1.676 m)     Head Circumference --      Peak Flow --      Pain Score  11/08/19 1932 10     Pain Loc --      Pain Edu? --      Excl. in GC? --     Constitutional: Alert and oriented.  Obese: In no distress. Head: Normocephalic and atraumatic. Eyes: Conjunctivae are normal. PERRL. Normal extraocular movements Cardiovascular: Normal rate, regular rhythm.  Radial pulses 2+ bilaterally. Respiratory: Normal respiratory effort. No wheezes/rales/rhonchi. Musculoskeletal: Decreased abduction, abduction, internal and external rotation of the left shoulder secondary to pain.  Pain with palpation over the left subacromial bursa.  Strength 5/5 BUE.  Handgrips equal. Neurologic:  Normal speech and language. No gross focal neurologic deficits are appreciated. Skin:  Skin is warm, dry and  intact. No rash, redness or warmth noted.  ____________________________________________   RADIOLOGY  Imaging Orders     DG Shoulder Left IMPRESSION:  1. No acute osseous injury.  2. Mild glenohumeral arthrosis with spurring along the inferior  glenoid labrum.  3. Soft tissue mineralization near the footprint of the  supraspinatus upon the greater tuberosity could reflect  hydroxyapatite deposition/calcific tendinosis.    ____________________________________________   INITIAL IMPRESSION / ASSESSMENT AND PLAN / ED COURSE  Acute Left Shoulder Pain:  DDX include arthritis, subacromial bursitis, tendonitis, labral/rotator cuff tear Xray left shoulder c/w suprspinatus tendonitis Decadron 10 mg IM today RX for Prednisone 10 mg daily x 7 days Ice for 10 minutes BID ____________________________________________  FINAL CLINICAL IMPRESSION(S) / ED DIAGNOSES  Final diagnoses:  Acute pain of left shoulder  Left supraspinatus tendinitis      Lorre Munroe, NP 11/08/19 2100    Jene Every, MD 11/08/19 2145

## 2020-03-21 ENCOUNTER — Emergency Department
Admission: EM | Admit: 2020-03-21 | Discharge: 2020-03-21 | Disposition: A | Discharge status: Home / Self Care | Payer: Self-pay | Attending: Emergency Medicine | Admitting: Emergency Medicine

## 2020-03-21 ENCOUNTER — Encounter: Payer: Self-pay | Admitting: Emergency Medicine

## 2020-03-21 ENCOUNTER — Emergency Department: Payer: Self-pay

## 2020-03-21 DIAGNOSIS — Z21 Asymptomatic human immunodeficiency virus [HIV] infection status: Secondary | ICD-10-CM | POA: Insufficient documentation

## 2020-03-21 DIAGNOSIS — I2510 Atherosclerotic heart disease of native coronary artery without angina pectoris: Secondary | ICD-10-CM | POA: Insufficient documentation

## 2020-03-21 DIAGNOSIS — M47816 Spondylosis without myelopathy or radiculopathy, lumbar region: Secondary | ICD-10-CM | POA: Insufficient documentation

## 2020-03-21 DIAGNOSIS — F17210 Nicotine dependence, cigarettes, uncomplicated: Secondary | ICD-10-CM | POA: Insufficient documentation

## 2020-03-21 DIAGNOSIS — Z955 Presence of coronary angioplasty implant and graft: Secondary | ICD-10-CM | POA: Insufficient documentation

## 2020-03-21 MED ORDER — TRAMADOL HCL 50 MG PO TABS: 50.0000 mg | ORAL_TABLET | Freq: Four times a day (QID) | ORAL | 0 refills | Status: AC | PRN

## 2020-03-21 MED ORDER — CYCLOBENZAPRINE HCL 10 MG PO TABS: 10.0000 mg | ORAL_TABLET | Freq: Three times a day (TID) | ORAL | 0 refills | Status: DC | PRN

## 2020-03-21 MED ORDER — IBUPROFEN 600 MG PO TABS: 600.0000 mg | ORAL_TABLET | Freq: Three times a day (TID) | ORAL | 0 refills | Status: DC | PRN

## 2020-03-21 NOTE — ED Notes (Signed)
Patient verbalizes understanding of discharge instructions. Opportunity for questioning and answers were provided. Armband removed by staff, pt discharged from ED. Ambulated out to lobby  

## 2020-03-21 NOTE — ED Triage Notes (Signed)
C/O back pain x 2 days.  AAOx3.  Skin warm and dry.  NAD

## 2020-03-21 NOTE — Discharge Instructions (Signed)
Follow discharge care instruction take medication as directed.  Follow-up with PCP for continued care. °

## 2020-03-21 NOTE — ED Provider Notes (Signed)
[facility] Emergency Department Provider Note   ____________________________________________   Event Date/Time   First MD Initiated Contact with Patient 03/21/20 1209     (approximate)  I have reviewed the triage vital signs and the nursing notes.   HISTORY Via interpreter Chief Complaint Back Pain    HPI Walter Pierce is a 49 y.o. male patient complain acute onset of low back pain for 2 days.  Patient has a history of chronic back pain but has worsened in the past 2 days.  Patient denies radicular component to his back pain.  Patient denies bladder or bowel dysfunction.  Patient rates pain as 8/10.  Patient described pain as "aching".  No palliative measure for complaint.         Past Medical History:  Diagnosis Date  . Coronary artery disease     Patient Active Problem List   Diagnosis Date Noted  . Migraine headache without aura 12/23/2014  . 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 Left hip pain 12/06/2014  . Morbid obesity (HCC) 12/06/2014  . Family history of diabetes mellitus (DM) 12/06/2014    Past Surgical History:  Procedure Laterality Date  . CORONARY ANGIOPLASTY    . CORONARY ANGIOPLASTY WITH STENT PLACEMENT  2010  . heart cath      Prior to Admission medications   Medication Sig Start Date End Date Taking? Authorizing Provider  cyclobenzaprine (FLEXERIL) 10 MG tablet Take 1 tablet (10 mg total) by mouth 3 (three) times daily as needed. 03/21/20   Joni Reining, PA-C  ibuprofen (ADVIL) 600 MG tablet Take 1 tablet (600 mg total) by mouth every 8 (eight) hours as needed. 03/21/20   Joni Reining, PA-C  predniSONE (DELTASONE) 10 MG tablet Take 1 tablet (10 mg total) by mouth daily. 11/08/19   Lorre Munroe, NP  traMADol (ULTRAM) 50 MG tablet Take 1 tablet (50 mg total) by mouth every 6 (six) hours as needed. 03/21/20 03/21/21  Joni Reining, PA-C    Allergies Patient has no known allergies.  Family History   Problem Relation Age of Onset  . Diabetes Mother   . Arthritis Mother   . Hypertension Mother   . Drug abuse Mother   . Heart attack Father   . Diabetes Maternal Grandmother   . Diabetes Maternal Grandfather   . Diabetes Paternal Grandmother   . Hypertension Sister   . Diabetes Daughter   . Hypertension Sister     Social History Social History   Tobacco Use  . Smoking status: Current Every Day Smoker    Packs/day: 1.00    Types: Cigarettes  . Smokeless tobacco: Never Used  Substance Use Topics  . Alcohol use: Yes    Comment: occ  . Drug use: No    Review of Systems Constitutional: No fever/chills Eyes: No visual changes. ENT: No sore throat. Cardiovascular: Denies chest pain. Respiratory: Denies shortness of breath. Gastrointestinal: No abdominal pain.  No nausea, no vomiting.  No diarrhea.  No constipation. Genitourinary: Negative for dysuria. Musculoskeletal: Positive for back pain. Skin: Negative for rash. Neurological: Negative for headaches, focal weakness or numbness. Allergic/Immunilogical: HIV  ____________________________________________   PHYSICAL EXAM:  VITAL SIGNS: ED Triage Vitals  Enc Vitals Group     BP 03/21/20 1119 (!) 149/90     Pulse Rate 03/21/20 1117 85     Resp 03/21/20 1117 18     Temp 03/21/20 1117 98.1 F (36.7 C)     Temp  Source 03/21/20 1117 Oral     SpO2 03/21/20 1117 98 %     Weight 03/21/20 1036 (!) 354 lb 15.1 oz (161 kg)     Height 03/21/20 1036 5\' 6"  (1.676 m)     Head Circumference --      Peak Flow --      Pain Score 03/21/20 1036 8     Pain Loc --      Pain Edu? --      Excl. in GC? --    Constitutional: Alert and oriented. Well appearing and in no acute distress. Cardiovascular: Normal rate, regular rhythm. Grossly normal heart sounds.  Good peripheral circulation. Respiratory: Normal respiratory effort.  No retractions. Lungs CTAB. Gastrointestinal: Soft and nontender. No distention. No abdominal bruits. No  CVA tenderness. Genitourinary: Deferred Musculoskeletal: Body habitus does limit the exam.  No obvious lumbar spine deformity.  Patient is moderate guarding palpation L1-L3.  Patient has decreased range of motion with flexion.  No lower extremity tenderness nor edema.  No joint effusions. Neurologic:  Normal speech and language. No gross focal neurologic deficits are appreciated. No gait instability. Skin:  Skin is warm, dry and intact. No rash noted. Psychiatric: Mood and affect are normal. Speech and behavior are normal.  ____________________________________________   LABS (all labs ordered are listed, but only abnormal results are displayed)  Labs Reviewed - No data to display ____________________________________________  EKG   ____________________________________________  RADIOLOGY I, 03/23/20, personally viewed and evaluated these images (plain radiographs) as part of my medical decision making, as well as reviewing the written report by the radiologist.  ED MD interpretation: Patient has moderate degenerative changes T and L-spine.  Official radiology report(s): DG Lumbar Spine 2-3 Views  Result Date: 03/21/2020 CLINICAL DATA:  Acute nontraumatic back pain 2 days EXAM: LUMBAR SPINE - 2-3 VIEW COMPARISON:  12/07/2015 FINDINGS: Negative for fracture or mass.  Mild retrolisthesis L3-4. Disc degeneration T12-L1 and L1-2 with disc space narrowing and anterior spurring. Mild disc space narrowing L4-5 and L5-S1. No pars defect. IMPRESSION: Mild degenerative change.  No acute abnormality. Electronically Signed   By: 12/09/2015 M.D.   On: 03/21/2020 13:17    ____________________________________________   PROCEDURES  Procedure(s) performed (including Critical Care):  Procedures   ____________________________________________   INITIAL IMPRESSION / ASSESSMENT AND PLAN / ED COURSE  As part of my medical decision making, I reviewed the following data within the  electronic MEDICAL RECORD NUMBER         Patient presents with acute onset of low back pain.  Discussed x-ray finding consistent with degenerative disc disease and arthritis.  Patient given discharge care instruction advised take medication as directed.  Patient advised follow-up PCP for continued care.      ____________________________________________   FINAL CLINICAL IMPRESSION(S) / ED DIAGNOSES  Final diagnoses:  Spondylosis of lumbar region without myelopathy or radiculopathy     ED Discharge Orders         Ordered    cyclobenzaprine (FLEXERIL) 10 MG tablet  3 times daily PRN        03/21/20 1343    ibuprofen (ADVIL) 600 MG tablet  Every 8 hours PRN        03/21/20 1343    traMADol (ULTRAM) 50 MG tablet  Every 6 hours PRN        03/21/20 1343          *Please note:  Walter Pierce was evaluated in Emergency Department on 03/21/2020  for the symptoms described in the history of present illness. He was evaluated in the context of the global COVID-19 pandemic, which necessitated consideration that the patient might be at risk for infection with the SARS-CoV-2 virus that causes COVID-19. Institutional protocols and algorithms that pertain to the evaluation of patients at risk for COVID-19 are in a state of rapid change based on information released by regulatory bodies including the CDC and federal and state organizations. These policies and algorithms were followed during the patient's care in the ED.  Some ED evaluations and interventions may be delayed as a result of limited staffing during and the pandemic.*   Note:  This document was prepared using Dragon voice recognition software and may include unintentional dictation errors.    Joni Reining, PA-C 03/21/20 1350    Minna Antis, MD 03/21/20 1355

## 2021-07-03 ENCOUNTER — Other Ambulatory Visit: Payer: Self-pay

## 2021-07-03 ENCOUNTER — Emergency Department: Payer: Self-pay

## 2021-07-03 ENCOUNTER — Emergency Department
Admission: EM | Admit: 2021-07-03 | Discharge: 2021-07-03 | Disposition: A | Discharge status: Home / Self Care | Payer: Self-pay | Attending: Emergency Medicine | Admitting: Emergency Medicine

## 2021-07-03 ENCOUNTER — Encounter: Payer: Self-pay | Admitting: Emergency Medicine

## 2021-07-03 DIAGNOSIS — F17210 Nicotine dependence, cigarettes, uncomplicated: Secondary | ICD-10-CM | POA: Insufficient documentation

## 2021-07-03 DIAGNOSIS — B349 Viral infection, unspecified: Secondary | ICD-10-CM

## 2021-07-03 DIAGNOSIS — U071 COVID-19: Secondary | ICD-10-CM | POA: Insufficient documentation

## 2021-07-03 LAB — CBC WITH DIFFERENTIAL/PLATELET
Abs Immature Granulocytes: 0.01 10*3/uL (ref 0.00–0.07)
Basophils Absolute: 0 10*3/uL (ref 0.0–0.1)
Basophils Relative: 1 %
Eosinophils Absolute: 0.1 10*3/uL (ref 0.0–0.5)
Eosinophils Relative: 1 %
HCT: 48.2 % (ref 39.0–52.0)
Hemoglobin: 15.8 g/dL (ref 13.0–17.0)
Immature Granulocytes: 0 %
Lymphocytes Relative: 27 %
Lymphs Abs: 1.5 10*3/uL (ref 0.7–4.0)
MCH: 30.8 pg (ref 26.0–34.0)
MCHC: 32.8 g/dL (ref 30.0–36.0)
MCV: 94 fL (ref 80.0–100.0)
Monocytes Absolute: 0.5 10*3/uL (ref 0.1–1.0)
Monocytes Relative: 10 %
Neutro Abs: 3.3 10*3/uL (ref 1.7–7.7)
Neutrophils Relative %: 61 %
Platelets: 170 10*3/uL (ref 150–400)
RBC: 5.13 MIL/uL (ref 4.22–5.81)
RDW: 14.3 % (ref 11.5–15.5)
WBC: 5.4 10*3/uL (ref 4.0–10.5)
nRBC: 0 % (ref 0.0–0.2)

## 2021-07-03 LAB — TROPONIN I (HIGH SENSITIVITY)
Troponin I (High Sensitivity): 6 ng/L (ref <18)
Troponin I (High Sensitivity): 5 ng/L (ref <18)

## 2021-07-03 LAB — COMPREHENSIVE METABOLIC PANEL
ALT: 21 U/L (ref 0–44)
AST: 23 U/L (ref 15–41)
Albumin: 3.4 g/dL — ABNORMAL LOW (ref 3.5–5.0)
Alkaline Phosphatase: 73 U/L (ref 38–126)
Anion gap: 8 (ref 5–15)
BUN: 16 mg/dL (ref 6–20)
CO2: 25 mmol/L (ref 22–32)
Calcium: 8.3 mg/dL — ABNORMAL LOW (ref 8.9–10.3)
Chloride: 102 mmol/L (ref 98–111)
Creatinine, Ser: 0.82 mg/dL (ref 0.61–1.24)
GFR, Estimated: >60 mL/min (ref >60)
Glucose, Bld: 91 mg/dL (ref 70–99)
Potassium: 4.1 mmol/L (ref 3.5–5.1)
Sodium: 135 mmol/L (ref 135–145)
Total Bilirubin: 0.6 mg/dL (ref 0.3–1.2)
Total Protein: 7.1 g/dL (ref 6.5–8.1)

## 2021-07-03 LAB — RESP PANEL BY RT-PCR (FLU A&B, COVID) ARPGX2
Influenza A by PCR: NEGATIVE
Influenza B by PCR: NEGATIVE
SARS Coronavirus 2 by RT PCR: POSITIVE — AB

## 2021-07-03 MED ORDER — ONDANSETRON 4 MG PO TBDP
4.0000 mg | ORAL_TABLET | Freq: Once | ORAL | Status: AC
  Administered 2021-07-03: 4 mg via ORAL
  Filled 2021-07-03: qty 1

## 2021-07-03 MED ORDER — NIRMATRELVIR/RITONAVIR (PAXLOVID)TABLET: 3.0000 | ORAL_TABLET | Freq: Two times a day (BID) | ORAL | 0 refills | Status: AC

## 2021-07-03 NOTE — ED Notes (Signed)
Pt NAD, a/ox4. Pt verbalizes understanding of all DC and f/u instructions. All questions answered. Pt walks with steady gait to lobby at DC.  ? ?

## 2021-07-03 NOTE — ED Provider Notes (Signed)
----------------------------------------- ?  2:09 PM on 07/03/2021 ?----------------------------------------- ? ?Blood pressure (!) 144/78, pulse 81, temperature 98.5 ?F (36.9 ?C), temperature source Oral, resp. rate 20, height 5\' 6"  (1.676 m), weight (!) 161 kg, SpO2 95 %. ? ?Assuming care from , PA-C/NP-C.  In short, Walter Pierce is a 51 y.o. male with a chief complaint of URI ?44  Refer to the original H&P for additional details. ? ?The current plan of care is to await viral panel test results and disposition accordingly. ? ?____________________________________________ ?  ?LABS (pertinent positives/negatives) ?Labs Reviewed  ?RESP PANEL BY RT-PCR (FLU A&B, COVID) ARPGX2 - Abnormal; Notable for the following components:  ?    Result Value  ? SARS Coronavirus 2 by RT PCR POSITIVE (*)   ? All other components within normal limits  ?COMPREHENSIVE METABOLIC PANEL - Abnormal; Notable for the following components:  ? Calcium 8.3 (*)   ? Albumin 3.4 (*)   ? All other components within normal limits  ?CBC WITH DIFFERENTIAL/PLATELET  ?TROPONIN I (HIGH SENSITIVITY)  ?TROPONIN I (HIGH SENSITIVITY)  ? ?____________________________________________ ? ?PROCEDURES ? ?Procedures ?____________________________________________ ? ?INITIAL IMPRESSION / ASSESSMENT AND PLAN / ED COURSE ? ?Patient to the ED with complaints of body aches and painful cough for 1 day.  Patient's evaluated for his complaints in ED, found have reassuring work-up.  His viral panel screen did confirm COVID.  Patient who is less than 24 hours into symptoms was offered Paxlovid, and agrees to take it.  He will be discharged with a prescription and instructions to monitor and treat fevers as necessary.  He will follow with primary provider return to the ED if needed.  Work note is provided as is appropriate. ? ?Walter Pierce was evaluated in Emergency Department on 07/03/2021 for the symptoms described in the history of present  illness. He was evaluated in the context of the global COVID-19 pandemic, which necessitated consideration that the patient might be at risk for infection with the SARS-CoV-2 virus that causes COVID-19. Institutional protocols and algorithms that pertain to the evaluation of patients at risk for COVID-19 are in a state of rapid change based on information released by regulatory bodies including the CDC and federal and state organizations. These policies and algorithms were followed during the patient's care in the ED. ?____________________________________________ ? ?FINAL CLINICAL IMPRESSION(S) / ED DIAGNOSES ? ?Final diagnoses:  ?Viral illness  ?COVID-19  ? ? ?  ?07/05/2021, PA-C ?07/03/21 1410 ? ?  ?07/05/21, MD ?07/03/21 1608 ? ?

## 2021-07-03 NOTE — Discharge Instructions (Addendum)
Take the prescription medicine as directed. Take OTC Tylenol or Motrin as needed for bodyaches and fevers. Take OTC cough medicine (Delsym) as needed for cough relief. Follow-up with your provider for continued symptoms. Return as needed.  ? ?Tome el medicamento recetado seg?n las indicaciones. Tome Tylenol o Motrin de venta libre seg?n sea necesario para el dolor de cuerpo y Insurance account manager. Tome un medicamento para la tos de venta libre (Delsym) seg?n sea necesario para Technical sales engineer tos. Haga un seguimiento con su proveedor para s?ntomas continuos. Regrese seg?n sea necesario. ?

## 2021-07-03 NOTE — ED Provider Notes (Signed)
? ?[facility] ?Provider Note ? ? ? Event Date/Time  ? First MD Initiated Contact with Patient 07/03/21 [phone]   ?  (approximate) ? ? ?History  ? ?URI ? ? ?HPI ? ?Walter Pierce is a 51 y.o. male   presents to the ED with complaint of last night sudden onset of feeling bad.  Patient states that he did have chills but did not take his temperature.  He reports some nausea but denies vomiting or diarrhea at this time.  Patient lives alone so he is unaware of any known exposure to COVID or influenza.  He reports he did get 2 vaccines for COVID in the early.  Of vaccination.  Patient admits to smoking 1 pack of cigarettes per day and also has a history of angioplasty which looks like 2010. ? ?  ? ? ?Physical Exam  ? ?Triage Vital Signs: ?ED Triage Vitals  ?Enc Vitals Group  ?   BP --   ?   Pulse --   ?   Resp --   ?   Temp --   ?   Temp src --   ?   SpO2 --   ?   Weight 07/03/21 0941 (!) 354 lb 15.1 oz (161 kg)  ?   Height 07/03/21 0941 5\' 6"  (1.676 m)  ?   Head Circumference --   ?   Peak Flow --   ?   Pain Score 07/03/21 0940 0  ?   Pain Loc --   ?   Pain Edu? --   ?   Excl. in GC? --   ? ? ?Most recent vital signs: ?Vitals:  ? 07/03/21 1217 07/03/21 1222  ?BP: (!) 156/106 (!) 158/106  ?Pulse:  80  ?Resp:  20  ?Temp:    ?SpO2:  94%  ? ? ? ?General: Awake, no distress.  Patient is able to talk in complete sentences and does not appear to be any shortness of breath. ?CV:  Good peripheral perfusion.  Heart regular rate and rhythm. ?Resp:  Normal effort.  Lungs are clear bilaterally without wheezes or rhonchi noted. ?Abd:  No distention.  ?Other:   ? ? ?ED Results / Procedures / Treatments  ? ?Labs ?(all labs ordered are listed, but only abnormal results are displayed) ?Labs Reviewed  ?COMPREHENSIVE METABOLIC PANEL - Abnormal; Notable for the following components:  ?    Result Value  ? Calcium 8.3 (*)   ? Albumin 3.4 (*)   ? All other components within normal limits  ?RESP PANEL BY RT-PCR (FLU  A&B, COVID) ARPGX2  ?CBC WITH DIFFERENTIAL/PLATELET  ?TROPONIN I (HIGH SENSITIVITY)  ?TROPONIN I (HIGH SENSITIVITY)  ? ? ? ?EKG ? ?Normal sinus rhythm ?Vent. rate 79 BPM ?PR interval 148 ms ?QRS duration 96 ms ?QT/QTcB 376/431 ms ?P-R-T axes 35 -3 -11 ?RADIOLOGY ? ?Chest x-ray images were reviewed by myself and no obvious infiltrate is noted.  Patient has a very large body habitus.  Radiology report read as no focal consolidation but low volume with hypoventilatory changes. ? ? ? ?PROCEDURES: ? ?Critical Care performed:  ? ?Procedures ? ? ?MEDICATIONS ORDERED IN ED: ?Medications  ?ondansetron (ZOFRAN-ODT) disintegrating tablet 4 mg (4 mg Oral Given 07/03/21 1004)  ? ? ? ?IMPRESSION / MDM / ASSESSMENT AND PLAN / ED COURSE  ?I reviewed the triage vital signs and the nursing notes. ? ? ?Differential diagnosis includes, but is not limited to, influenza, COVID, pneumonia, cardiac history.  Elevated  blood pressure without diagnosis of hypertension. ? ?----------------------------------------- ?12:31 PM on 07/03/2021 ?----------------------------------------- ?At this time patient's troponin is 6, he was given Zofran for the nausea, chest x-ray does not show any acute changes, lab work is reassuring with CBC and CMP.  Patient's blood pressure continues to be elevated with diastolic of 104-107 which is being monitored.  I discussed patient's case with Zigmund Gottron, PA-C who will be taking over care of this patient as we are waiting for his nasal swab results. ? ? ? ?  ? ? ?FINAL CLINICAL IMPRESSION(S) / ED DIAGNOSES  ? ?Final diagnoses:  ?Viral illness  ? ? ? ?Rx / DC Orders  ? ?ED Discharge Orders   ? ? None  ? ?  ? ? ? ?Note:  This document was prepared using Dragon voice recognition software and may include unintentional dictation errors. ?  ?Tommi Rumps, PA-C ?07/03/21 1233 ? ?  ?Jene Every, MD ?07/03/21 1237 ? ?

## 2021-07-03 NOTE — ED Triage Notes (Signed)
C/O body aches and painful cough x 1 day. ? ?AAOx3.  Skin warm and dry. NAD ?

## 2021-07-31 ENCOUNTER — Emergency Department: Payer: Self-pay

## 2021-07-31 ENCOUNTER — Other Ambulatory Visit: Payer: Self-pay

## 2021-07-31 ENCOUNTER — Encounter: Payer: Self-pay | Admitting: Emergency Medicine

## 2021-07-31 ENCOUNTER — Emergency Department
Admission: EM | Admit: 2021-07-31 | Discharge: 2021-07-31 | Disposition: A | Discharge status: Home / Self Care | Payer: Self-pay | Attending: Emergency Medicine | Admitting: Emergency Medicine

## 2021-07-31 DIAGNOSIS — H1033 Unspecified acute conjunctivitis, bilateral: Secondary | ICD-10-CM | POA: Insufficient documentation

## 2021-07-31 DIAGNOSIS — J302 Other seasonal allergic rhinitis: Secondary | ICD-10-CM

## 2021-07-31 DIAGNOSIS — Z87891 Personal history of nicotine dependence: Secondary | ICD-10-CM | POA: Insufficient documentation

## 2021-07-31 DIAGNOSIS — Z8616 Personal history of COVID-19: Secondary | ICD-10-CM | POA: Insufficient documentation

## 2021-07-31 DIAGNOSIS — J301 Allergic rhinitis due to pollen: Secondary | ICD-10-CM | POA: Insufficient documentation

## 2021-07-31 DIAGNOSIS — I2510 Atherosclerotic heart disease of native coronary artery without angina pectoris: Secondary | ICD-10-CM | POA: Insufficient documentation

## 2021-07-31 MED ORDER — TOBRAMYCIN 0.3 % OP SOLN: 2.0000 [drp] | Freq: Four times a day (QID) | OPHTHALMIC | 0 refills | Status: AC

## 2021-07-31 MED ORDER — PSEUDOEPH-BROMPHEN-DM 30-2-10 MG/5ML PO SYRP: 5.0000 mL | ORAL_SOLUTION | Freq: Four times a day (QID) | ORAL | 0 refills | Status: AC | PRN

## 2021-07-31 NOTE — ED Provider Notes (Signed)
? ?[facility] ?Provider Note ? ? ? Event Date/Time  ? First MD Initiated Contact with Patient 07/31/21 0708   ?  (approximate) ? ? ?History  ? ?Cough ? ? ?HPI ?Spanish interpreter per [employer] ?Walter Pierce is a 51 y.o. male presents to the ED with complaint of cough, nasal congestion, rhinorrhea, sneezing and pink eyes with yellow drainage.  Patient denies any fever, chills, nausea or vomiting.  Patient states that he has had some diarrhea which is not unusual for him.  No one in his home is sick with similar symptoms.  Patient reports he did have COVID and was seen in the emergency department last month and since that time has been tired.  Patient has a history of CAD, obesity and smoking 1 pack cigarettes per day. ?  ? ? ?Physical Exam  ? ?Triage Vital Signs: ?ED Triage Vitals  ?Enc Vitals Group  ?   BP 07/31/21 0654 (!) 159/88  ?   Pulse Rate 07/31/21 0654 92  ?   Resp 07/31/21 0654 17  ?   Temp 07/31/21 0654 98.4 ?F (36.9 ?C)  ?   Temp Source 07/31/21 0654 Oral  ?   SpO2 07/31/21 0654 96 %  ?   Weight 07/31/21 0653 (!) 387 lb (175.5 kg)  ?   Height 07/31/21 0653 5\' 7"  (1.702 m)  ?   Head Circumference --   ?   Peak Flow --   ?   Pain Score --   ?   Pain Loc --   ?   Pain Edu? --   ?   Excl. in GC? --   ? ? ?Most recent vital signs: ?Vitals:  ? 07/31/21 0654  ?BP: (!) 159/88  ?Pulse: 92  ?Resp: 17  ?Temp: 98.4 ?F (36.9 ?C)  ?SpO2: 96%  ? ? ? ?General: Awake, no distress.  Able to speak in complete sentences without any difficulty. ?CV:  Good peripheral perfusion.  ?Resp:  Normal effort.  Lungs are clear bilaterally. ?Abd:  No distention.  ?Other:  Conjunctive are injected bilaterally with mild yellow exudate present.  Moderate nasal congestion. ? ? ?ED Results / Procedures / Treatments  ? ?Labs ?(all labs ordered are listed, but only abnormal results are displayed) ?Labs Reviewed - No data to display ? ? ?RADIOLOGY ?Chest x-ray images were reviewed by me independent of the  radiologist and no actual infiltrate was noted.  Radiology report is negative for acute cardiopulmonary disease.  Low volume was noted. ? ? ? ?PROCEDURES: ? ?Critical Care performed:  ? ?Procedures ? ? ?MEDICATIONS ORDERED IN ED: ?Medications - No data to display ? ? ?IMPRESSION / MDM / ASSESSMENT AND PLAN / ED COURSE  ?I reviewed the triage vital signs and the nursing notes. ? ? ?Differential diagnosis includes, but is not limited to, influenza, viral URI, seasonal allergies, conjunctivitis allergic or bacterial. ? ?51 year old male presents to the ED with complaint of congestion, rhinorrhea, cough and watery eyes.  Patient has taken over-the-counter Tylenol without any relief.  Patient was seen in the emergency department March 27 and diagnosed with COVID, but states that he got much better with the exception of some mild fatigue.  He denied any shortness of breath or difficulty breathing.  Chest x-ray confirms that there is no pneumonia.  Physical exam is positive for nasal congestion and also conjunctivitis which may be potentially allergy related however there is some yellow exudate present.  He was made aware by the  Spanish interpreter that he will be getting 2 medications at the pharmacy.  1 is for cough and congestion the other is an eyedrop to use in both eyes. ? ? ?FINAL CLINICAL IMPRESSION(S) / ED DIAGNOSES  ? ?Final diagnoses:  ?Acute bacterial conjunctivitis of both eyes  ?Seasonal allergies  ? ? ? ?Rx / DC Orders  ? ?ED Discharge Orders   ? ?      Ordered  ?  brompheniramine-pseudoephedrine-DM 30-2-10 MG/5ML syrup  4 times daily PRN       ? 07/31/21 0818  ?  tobramycin (TOBREX) 0.3 % ophthalmic solution  Every 6 hours       ? 07/31/21 0818  ? ?  ?  ? ?  ? ? ? ?Note:  This document was prepared using Dragon voice recognition software and may include unintentional dictation errors. ?  ?Tommi Rumps, PA-C ?07/31/21 1015 ? ?  ?Concha Se, MD ?08/03/21 1530 ? ?

## 2021-07-31 NOTE — Discharge Instructions (Addendum)
Call the clinics listed on your discharge papers so that you may obtain a primary care provider. ?Bromfed-DM is for cough and congestion every 6 hours as needed.  Tobramycin ophthalmic solution is 2 drops to your eyes 4 times a day.  After finishing the cough medicine you may obtain either Claritin or Zyrtec over-the-counter for allergy symptoms. ?

## 2021-07-31 NOTE — ED Notes (Signed)
See triage note  presents with 3-4 days of watery eyes ,runny nose and cough   no fever  ?

## 2021-07-31 NOTE — ED Triage Notes (Signed)
Information obtained with assist of spanish interpreter xanat # [id number]. Pt states for several days has been having watery eyes, cough, and runny nose. Pt appears in no acute distress, denies fever.  ?

## 2021-08-01 ENCOUNTER — Encounter: Payer: Self-pay | Admitting: Emergency Medicine

## 2021-08-01 ENCOUNTER — Other Ambulatory Visit: Payer: Self-pay

## 2021-08-01 ENCOUNTER — Emergency Department
Admission: EM | Admit: 2021-08-01 | Discharge: 2021-08-01 | Disposition: A | Discharge status: Home / Self Care | Payer: Self-pay | Attending: Student in an Organized Health Care Education/Training Program | Admitting: Student in an Organized Health Care Education/Training Program

## 2021-08-01 DIAGNOSIS — F17200 Nicotine dependence, unspecified, uncomplicated: Secondary | ICD-10-CM | POA: Insufficient documentation

## 2021-08-01 DIAGNOSIS — R059 Cough, unspecified: Secondary | ICD-10-CM | POA: Insufficient documentation

## 2021-08-01 DIAGNOSIS — R42 Dizziness and giddiness: Secondary | ICD-10-CM | POA: Insufficient documentation

## 2021-08-01 MED ORDER — ALBUTEROL SULFATE HFA 108 (90 BASE) MCG/ACT IN AERS: 2.0000 | INHALATION_SPRAY | Freq: Four times a day (QID) | RESPIRATORY_TRACT | 2 refills | Status: AC | PRN

## 2021-08-01 NOTE — ED Notes (Signed)
See triage note  states the cough medication is making him dizzy when he takes it  pt is afebrile on arrival ?

## 2021-08-01 NOTE — ED Triage Notes (Signed)
Pt here with dizziness. 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 Pt states that he is still coughing a little.  ?

## 2021-08-01 NOTE — ED Provider Notes (Signed)
? ?[facility] ?Provider Note ? ? ? Event Date/Time  ? First MD Initiated Contact with Patient 08/01/21 [phone]   ?  (approximate) ? ? ?History  ? ?Dizziness ? ? ?HPI ? ?Walter Pierce is a 51 y.o. male history of smoking seen yesterday and treated for allergies cough and congestion.  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  Patient denies any productive cough no fevers denies any chest pain denies any numbness or tingling.  When he describes dizziness feels a little bit lightheaded and like it is a side effect of the medication denies any headache.   ?  ? ? ?Physical Exam  ? ?Triage Vital Signs: ?ED Triage Vitals  ?Enc Vitals Group  ?   BP 08/01/21 0935 113/77  ?   Pulse Rate 08/01/21 0935 96  ?   Resp 08/01/21 0935 20  ?   Temp 08/01/21 0935 97.7 ?F (36.5 ?C)  ?   Temp Source 08/01/21 0935 Oral  ?   SpO2 08/01/21 0935 97 %  ?   Weight 08/01/21 0938 (!) 386 lb (175.1 kg)  ?   Height 08/01/21 0938 5\' 7"  (1.702 m)  ?   Head Circumference --   ?   Peak Flow --   ?   Pain Score 08/01/21 0938 0  ?   Pain Loc --   ?   Pain Edu? --   ?   Excl. in GC? --   ? ? ?Most recent vital signs: ?Vitals:  ? 08/01/21 0935  ?BP: 113/77  ?Pulse: 96  ?Resp: 20  ?Temp: 97.7 ?F (36.5 ?C)  ?SpO2: 97%  ? ? ? ?Constitutional: Alert  ?Eyes: Conjunctivae are normal.  ?Head: Atraumatic. ?Nose: No congestion/rhinnorhea. ?Mouth/Throat: Mucous membranes are moist.   ?Neck: Painless ROM.  ?Cardiovascular:   Good peripheral circulation. No m/g/r ?Respiratory: Normal respiratory effort.  No retractions. Coarse bibasilar breathsounds, no crackles ?Gastrointestinal: Soft and nontender.  ?Musculoskeletal:  no deformity ?Neurologic:  MAE spontaneously. No gross focal neurologic deficits are appreciated.  ?Skin:  Skin is warm, dry and  intact. No rash noted. ?Psychiatric: Mood and affect are normal. Speech and behavior are normal. ? ? ? ?ED Results / Procedures / Treatments  ? ?Labs ?(all labs ordered are listed, but only abnormal results are displayed) ?Labs Reviewed - No data to display ? ? ?EKG ? ? ? ? ?RADIOLOGY ? ? ? ?PROCEDURES: ? ?Critical Care performed:  ? ?Procedures ? ? ?MEDICATIONS ORDERED IN ED: ?Medications - No data to display ? ? ?IMPRESSION / MDM / ASSESSMENT AND PLAN / ED COURSE  ?I reviewed the triage vital signs and the nursing notes. ?             ?               ? ?Differential diagnosis includes, but is not limited to, medication effect, dehydration, colitis, URI ? ?Patient presenting to the ER for work note.  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  Patient with steady gait neuro exam is reassuring and nonfocal.  Denies any chest pain or pressure.  He is a smoker and does report cough.  He denies any abdominal pain.  Presentation not consistent with sepsis CHF COPD exacerbation not consistent with PE not consistent with ACS.  Do not feel that imaging clinically indicated based on his presentation.  Will be given a prescription for inhaler.  Discussed return precautions.  Patient agreeable to plan. ? ? ?  ? ? ?FINAL CLINICAL IMPRESSION(S) / ED DIAGNOSES  ? ?Final diagnoses:  ?Cough, unspecified type  ?Dizziness  ? ? ? ?Rx / DC Orders  ? ?ED Discharge Orders   ? ?      Ordered  ?  albuterol (VENTOLIN HFA) 108 (90 Base) MCG/ACT inhaler  Every 6 hours PRN       ? 08/01/21 0950  ? ?  ?  ? ?  ? ? ? ?Note:  This document was prepared using Dragon voice recognition software and may include unintentional dictation errors. ? ?  ?Willy Eddy, MD ?08/01/21 [phone] ? ?

## 2021-08-31 ENCOUNTER — Other Ambulatory Visit: Payer: Self-pay

## 2021-08-31 ENCOUNTER — Emergency Department
Admission: EM | Admit: 2021-08-31 | Discharge: 2021-08-31 | Disposition: A | Discharge status: Home / Self Care | Payer: BC Managed Care – PPO | Attending: Emergency Medicine | Admitting: Emergency Medicine

## 2021-08-31 ENCOUNTER — Encounter: Payer: Self-pay | Admitting: Emergency Medicine

## 2021-08-31 DIAGNOSIS — M545 Low back pain, unspecified: Secondary | ICD-10-CM | POA: Diagnosis not present

## 2021-08-31 DIAGNOSIS — M5459 Other low back pain: Secondary | ICD-10-CM | POA: Diagnosis not present

## 2021-08-31 MED ORDER — LIDOCAINE 5 % EX PTCH: 1.0000 | MEDICATED_PATCH | Freq: Two times a day (BID) | CUTANEOUS | 0 refills | Status: AC

## 2021-08-31 MED ORDER — BACLOFEN 10 MG PO TABS: 10.0000 mg | ORAL_TABLET | Freq: Three times a day (TID) | ORAL | 0 refills | Status: DC | PRN

## 2021-08-31 MED ORDER — KETOROLAC TROMETHAMINE 30 MG/ML IJ SOLN
30.0000 mg | Freq: Once | INTRAMUSCULAR | Status: AC
  Administered 2021-08-31: 30 mg via INTRAMUSCULAR
  Filled 2021-08-31: qty 1

## 2021-08-31 MED ORDER — TRAMADOL HCL 50 MG PO TABS: 100.0000 mg | ORAL_TABLET | Freq: Four times a day (QID) | ORAL | 0 refills | Status: DC | PRN

## 2021-08-31 MED ORDER — LIDOCAINE 5 % EX PTCH
1.0000 | MEDICATED_PATCH | CUTANEOUS | Status: DC
  Administered 2021-08-31: 1 via TRANSDERMAL
  Filled 2021-08-31: qty 1

## 2021-08-31 NOTE — Discharge Instructions (Signed)
You were evaluated in the Emergency Department today for back pain. Your evaluation suggests no acute abnormalities which require further intervention at this time.   - Move around as tolerated but avoiding heavy lifting. "Bed rest" is not recommended nor is it the best treatment for low back pain.  - Medications will help control your discomfort: -- Ibuprofen (600 mg three times daily with meals). -- Any other prescriptions you were provided as per label instructions  -- Do not drink alcohol, drive a car, operate machinery, or get up on ladders or heights when taking any prescribed pain medications.  -- Do not drive home if you received prescribed pain medications here in the ED.  Please follow up with your primary care physician as needed or any other providers listed in this paperwork. If you do not have a primary doctor, you can call your insurance company to find one.  If you do not have insurance, you can go to the finance/registration department for more assistance.  Return to the ED immediately if you develop any of the following problems: -- Leaking urine or difficulty urinating; -- Inability to control your bowels; -- New numbness or weakness in your legs or numbness between your legs; -- Inability to walk -- Fever  -------  Usted fue evaluado en el Departamento de Emergencias hoy por dolor de espalda. Su evaluacin no sugiere anomalas agudas que requieran una intervencin adicional en [employer].  - Muvase segn lo tolere, pero evite levantar objetos pesados. No se recomienda el "reposo en cama" ni es el mejor tratamiento para Producer, television/film/video.  - Los medicamentos ayudarn a Chief Operating Officer su Dentist: -- Ibuprofeno (600 mg tres veces al [employer] con las comidas). -- Cualquier otra receta que le hayan proporcionado segn las instrucciones de la etiqueta  -- No beba alcohol, conduzca un automvil, opere maquinaria ni se suba a escaleras o alturas cuando est tomando cualquier  analgsico recetado.  -- No conduzca a su casa si recibi analgsicos recetados aqu en el servicio de urgencias.  Haga un seguimiento con su mdico de atencin primaria segn sea necesario o con cualquier otro proveedor mencionado en este documento. Si no tiene un mdico de cabecera, puede llamar a su compaa de seguros para encontrar uno. Si no tiene seguro, puede ir al departamento de finanzas/registro para obtener ms ayuda.  Regrese al servicio de urgencias de inmediato si presenta alguno de los siguientes problemas: -- Prdida de orina o dificultad para Geographical information systems officer; -- Incapacidad para controlar sus evacuaciones; -- Nuevo entumecimiento o debilidad en las piernas o entumecimiento entre las piernas; -- Incapacidad para caminar -- Grant Ruts

## 2021-08-31 NOTE — ED Provider Notes (Signed)
[facility] Provider Note    Event Date/Time   First MD Initiated Contact with Patient 08/31/21 [phone]     (approximate)   History   Back Pain   HPI  The patient and/or family speak(s) Spanish.  They understand they have the right to the use of a hospital interpreter, however at this time they prefer to speak directly with me in Spanish.  They know that they can ask for an interpreter at any time.  Walter Pierce is a 51 y.o. male who presents for evaluation of low back pain.  He has a history of the same.  He said that it hurts a little bit every day, but usually does not provide much of a problem.  However it started hurting a lot more yesterday.  He thinks he may have strained when he bent over to lift something.  Moving around makes it worse, nothing particular makes it better, but he is able to ambulate without difficulty.  He has no numbness nor tingling and the pain does not radiate down the back of his legs, it is just present on both sides of his lower back similar to prior.  He is having no difficulty urinating and has had no bowel changes.  No recent fever, no chest pain or shortness of breath, no cough.  No blunt force trauma.     Physical Exam   Triage Vital Signs: ED Triage Vitals  Enc Vitals Group     BP 08/31/21 0529 (!) 165/97     Pulse Rate 08/31/21 0529 77     Resp 08/31/21 0529 20     Temp 08/31/21 0529 97.8 F (36.6 C)     Temp Source 08/31/21 0529 Oral     SpO2 08/31/21 0529 98 %     Weight 08/31/21 0526 (!) 175.1 kg (386 lb 0.4 oz)     Height 08/31/21 0526 1.702 m (5\' 7" )     Head Circumference --      Peak Flow --      Pain Score 08/31/21 0526 9     Pain Loc --      Pain Edu? --      Excl. in GC? --     Most recent vital signs: Vitals:   08/31/21 0529  BP: (!) 165/97  Pulse: 77  Resp: 20  Temp: 97.8 F (36.6 C)  SpO2: 98%     General: Awake, no distress.  CV:  Good peripheral perfusion.  Resp:  Normal  effort.  Abd:  No distention.  Other:  Ambulatory without difficulty.  Some pain with flexing forward at the waist.  Soft tissue tenderness to palpation of the lumbar region without any specific point tenderness.  No skin changes or rash including no vesicular lesions.   ED Results / Procedures / Treatments   Labs (all labs ordered are listed, but only abnormal results are displayed) Labs Reviewed - No data to display     PROCEDURES:  Critical Care performed: No  Procedures   MEDICATIONS ORDERED IN ED: Medications  lidocaine (LIDODERM) 5 % 1 patch (1 patch Transdermal Patch Applied 08/31/21 0545)  ketorolac (TORADOL) 30 MG/ML injection 30 mg (30 mg Intramuscular Given 08/31/21 0545)     IMPRESSION / MDM / ASSESSMENT AND PLAN / ED COURSE  I reviewed the triage vital signs and the nursing notes.  Differential diagnosis includes, but is not limited to, lumbar strain, degenerative joint disease, slipped disc, osteomyelitis, transverse myelitis, epidural abscess.  Patient's presentation is most consistent with exacerbation of chronic illness.  Patient's physical exam is reassuring and he has no focal neurological deficits to suggest cauda equina syndrome or other neurosurgical emergency.  I reviewed his last ED visit from 2 years ago and saw he had some lumbar x-rays at that time that were generally unrevealing and just showed chronic changes.  There is no indication for additional x-rays tonight and no indication for emergent MRI.  The patient confirmed that he felt better after medication treatment during his last visit.  He has never seen a specialist  I ordered Toradol 30 mg intramuscular and a Lidoderm patch.  I written prescriptions as listed below and strongly encouraged him to follow-up with Dr. Myer Haff with neurosurgery.  He agrees with the plan and again my usual and customary low back pain return precautions.        FINAL CLINICAL  IMPRESSION(S) / ED DIAGNOSES   Final diagnoses:  Bilateral low back pain without sciatica, unspecified chronicity     Rx / DC Orders   ED Discharge Orders          Ordered    baclofen (LIORESAL) 10 MG tablet  3 times daily PRN        08/31/21 0544    traMADol (ULTRAM) 50 MG tablet  Every 6 hours PRN        08/31/21 0544    lidocaine (LIDODERM) 5 %  Every 12 hours        08/31/21 0544             Note:  This document was prepared using Dragon voice recognition software and may include unintentional dictation errors.   Loleta Rose, MD 08/31/21 [phone]

## 2021-08-31 NOTE — ED Triage Notes (Signed)
Patient ambulatory to triage with steady gait, without difficulty or distress noted; pt reports lower back pain since yesterday with no accomp symptoms; st hx of same

## 2021-09-12 ENCOUNTER — Encounter: Payer: Self-pay | Admitting: Internal Medicine

## 2021-09-12 ENCOUNTER — Ambulatory Visit (INDEPENDENT_AMBULATORY_CARE_PROVIDER_SITE_OTHER): Payer: BC Managed Care – PPO | Admitting: Internal Medicine

## 2021-09-12 VITALS — BP 162/101 | HR 85 | Ht 67.0 in | Wt 364.4 lb

## 2021-09-12 DIAGNOSIS — E669 Obesity, unspecified: Secondary | ICD-10-CM

## 2021-09-12 DIAGNOSIS — M25552 Pain in left hip: Secondary | ICD-10-CM | POA: Diagnosis not present

## 2021-09-12 DIAGNOSIS — G43009 Migraine without aura, not intractable, without status migrainosus: Secondary | ICD-10-CM

## 2021-09-12 DIAGNOSIS — I10 Essential (primary) hypertension: Secondary | ICD-10-CM

## 2021-09-12 DIAGNOSIS — Z21 Asymptomatic human immunodeficiency virus [HIV] infection status: Secondary | ICD-10-CM

## 2021-09-12 MED ORDER — BACLOFEN 10 MG PO TABS: 10.0000 mg | ORAL_TABLET | Freq: Three times a day (TID) | ORAL | 4 refills | Status: AC | PRN

## 2021-09-12 MED ORDER — TRAMADOL HCL 50 MG PO TABS: 100.0000 mg | ORAL_TABLET | Freq: Two times a day (BID) | ORAL | 1 refills | Status: AC

## 2021-09-12 MED ORDER — LOSARTAN POTASSIUM-HCTZ 50-12.5 MG PO TABS: 1.0000 | ORAL_TABLET | Freq: Every day | ORAL | 1 refills | Status: DC

## 2021-09-12 NOTE — Assessment & Plan Note (Signed)
Suggest repeat test

## 2021-09-12 NOTE — Assessment & Plan Note (Signed)
Migraine is stable on the present medication

## 2021-09-12 NOTE — Assessment & Plan Note (Signed)

## 2021-09-12 NOTE — Assessment & Plan Note (Signed)

## 2021-09-12 NOTE — Progress Notes (Signed)
New Patient Office Visit  Subjective:  Patient ID: Walter Pierce, male    DOB: 01/16/1971  Age: 51 y.o. MRN: [id number]  CC:  Chief Complaint  Patient presents with   New Patient (Initial Visit)    HPI Patient presents for establish care, he complains of lower back pain also complaining of migraine headache he has a problem with obesity his blood pressure is elevated,  Past Medical History:  Diagnosis Date   Coronary artery disease      Current Outpatient Medications:    albuterol (VENTOLIN HFA) 108 (90 Base) MCG/ACT inhaler, Inhale 2 puffs into the lungs every 6 (six) hours as needed for wheezing or shortness of breath., Disp: 8 g, Rfl: 2   brompheniramine-pseudoephedrine-DM 30-2-10 MG/5ML syrup, Take 5 mLs by mouth 4 (four) times daily as needed., Disp: 120 mL, Rfl: 0   lidocaine (LIDODERM) 5 %, Place 1 patch onto the skin every 12 (twelve) hours. Remove & Discard patch within 12 hours or as directed by MD.  Leave the patch off for 12 hours before applying a new one., Disp: 10 patch, Rfl: 0   losartan-hydrochlorothiazide (HYZAAR) 50-12.5 MG tablet, Take 1 tablet by mouth daily., Disp: 90 tablet, Rfl: 1   tobramycin (TOBREX) 0.3 % ophthalmic solution, Place 2 drops into both eyes every 6 (six) hours., Disp: 5 mL, Rfl: 0   baclofen (LIORESAL) 10 MG tablet, Take 1 tablet (10 mg total) by mouth 3 (three) times daily as needed for muscle spasms (back pain)., Disp: 30 tablet, Rfl: 4   traMADol (ULTRAM) 50 MG tablet, Take 2 tablets (100 mg total) by mouth 2 (two) times daily., Disp: 60 tablet, Rfl: 1   Past Surgical History:  Procedure Laterality Date   CORONARY ANGIOPLASTY     CORONARY ANGIOPLASTY WITH STENT PLACEMENT  2010   heart cath      Family History  Problem Relation Age of Onset   Diabetes Mother    Arthritis Mother    Hypertension Mother    Drug abuse Mother    Heart attack Father    Diabetes Maternal Grandmother    Diabetes Maternal Grandfather     Diabetes Paternal Grandmother    Hypertension Sister    Diabetes Daughter    Hypertension Sister     Social History   Socioeconomic History   Marital status: Single    Spouse name: Not on file   Number of children: Not on file   Years of education: Not on file   Highest education level: Not on file  Occupational History   Not on file  Tobacco Use   Smoking status: Every Day    Packs/day: 1.00    Types: Cigarettes   Smokeless tobacco: Never  Vaping Use   Vaping Use: Never used  Substance and Sexual Activity   Alcohol use: Yes    Comment: occ   Drug use: No   Sexual activity: Not on file  Other Topics Concern   Not on file  Social History Narrative   Not on file   Social Determinants of Health   Financial Resource Strain: Not on file  Food Insecurity: Not on file  Transportation Needs: Not on file  Physical Activity: Not on file  Stress: Not on file  Social Connections: Not on file  Intimate Partner Violence: Not on file    ROS Review of Systems  Constitutional: Negative.   HENT: Negative.    Eyes: Negative.   Respiratory: Negative.    Cardiovascular:  Negative.   Gastrointestinal: Negative.   Endocrine: Negative.   Genitourinary: Negative.   Musculoskeletal: Negative.   Skin: Negative.   Allergic/Immunologic: Negative.   Neurological: Negative.   Hematological: Negative.   Psychiatric/Behavioral: Negative.    All other systems reviewed and are negative.  Objective:   Today's Vitals: BP (!) 162/101   Pulse 85   Ht 5\' 7"  (1.702 m)   Wt (!) 364 lb 6.4 oz (165.3 kg)   BMI 57.07 kg/m   Physical Exam Constitutional:      Appearance: Normal appearance. He is obese.  HENT:     Head: Normocephalic.     Nose: Nose normal.  Cardiovascular:     Rate and Rhythm: Normal rate.     Heart sounds: No murmur heard. Pulmonary:     Effort: Pulmonary effort is normal.     Breath sounds: No wheezing or rales.  Musculoskeletal:        General: No swelling or  deformity.     Cervical back: Normal range of motion.     Comments: Patient is tender in the lower back  Skin:    General: Skin is warm.  Neurological:     General: No focal deficit present.     Mental Status: He is alert.    Assessment & Plan:   Problem List Items Addressed This Visit       Cardiovascular and Mediastinum   Migraine headache without aura    Migraine is stable on the present medication       Relevant Medications   losartan-hydrochlorothiazide (HYZAAR) 50-12.5 MG tablet   traMADol (ULTRAM) 50 MG tablet   baclofen (LIORESAL) 10 MG tablet   Primary hypertension - Primary     Patient denies any chest pain or shortness of breath there is no history of palpitation or paroxysmal nocturnal dyspnea   patient was advised to follow low-salt low-cholesterol diet    ideally I want to keep systolic blood pressure below mmHg, patient was asked to check blood pressure one times a week and give me a report on that.  Patient will be follow-up in 3 months  or earlier as needed, patient will call me back for any change in the cardiovascular symptoms Patient was advised to buy a book from local bookstore concerning blood pressure and read several chapters  every day.  This will be supplemented by some of the material we will give him from the office.  Patient should also utilize other resources like YouTube and Internet to learn more about the blood pressure and the diet.       Relevant Medications   losartan-hydrochlorothiazide (HYZAAR) 50-12.5 MG tablet     Other   Left hip pain   Relevant Orders   AMB referral to orthopedics   Obesity with serious comorbidity    - I encouraged the patient to lose weight.  - I educated them on making healthy dietary choices including eating more fruits and vegetables and less fried foods. - I encouraged the patient to exercise more, and educated on the benefits of exercise including weight loss, diabetes prevention, and hypertension  prevention.   Dietary counseling with a registered dietician  Referral to a weight management support group (e.g. Weight Watchers, Overeaters Anonymous)  If your BMI is greater than 29 or you have gained more than 15 pounds you should work on weight loss.  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   brompheniramine-pseudoephedrine-DM 30-2-10 MG/5ML syrup Take 5 mLs by mouth 4 (four) times daily as needed.   lidocaine (LIDODERM) 5 % Place 1 patch onto the skin every 12 (twelve) hours. Remove & Discard patch within 12 hours or as directed by MD.  Wynelle Fanny the patch off for 12 hours before applying a new one.   losartan-hydrochlorothiazide (HYZAAR) 50-12.5 MG tablet Take 1 tablet by mouth daily.   tobramycin (TOBREX) 0.3 % ophthalmic solution Place 2 drops into both eyes every 6 (six) hours.   [DISCONTINUED] baclofen (LIORESAL) 10 MG tablet Take 1 tablet (10 mg total) by mouth 3 (three) times daily as needed for muscle spasms (back pain).   [DISCONTINUED] traMADol (ULTRAM) 50 MG tablet Take 2 tablets (100 mg total) by mouth every 6 (six) hours as needed for moderate pain or severe pain.   baclofen (LIORESAL) 10 MG tablet Take 1 tablet (10 mg total) by mouth 3 (three) times daily as needed for muscle spasms (back pain).   traMADol (ULTRAM) 50 MG tablet Take 2 tablets (100 mg total) by mouth 2 (two) times daily.   No facility-administered encounter medications on file as of 09/12/2021.    Follow-up: No follow-ups on file.   Corky Downs, MD

## 2021-10-02 ENCOUNTER — Other Ambulatory Visit: Payer: Self-pay

## 2021-10-02 ENCOUNTER — Emergency Department: Payer: BC Managed Care – PPO

## 2021-10-02 ENCOUNTER — Emergency Department
Admission: EM | Admit: 2021-10-02 | Discharge: 2021-10-02 | Disposition: A | Discharge status: Home / Self Care | Payer: BC Managed Care – PPO | Attending: Emergency Medicine | Admitting: Emergency Medicine

## 2021-10-02 ENCOUNTER — Encounter: Payer: Self-pay | Admitting: Medical Oncology

## 2021-10-02 DIAGNOSIS — R0789 Other chest pain: Secondary | ICD-10-CM | POA: Diagnosis not present

## 2021-10-02 DIAGNOSIS — I10 Essential (primary) hypertension: Secondary | ICD-10-CM | POA: Insufficient documentation

## 2021-10-02 DIAGNOSIS — I25119 Atherosclerotic heart disease of native coronary artery with unspecified angina pectoris: Secondary | ICD-10-CM | POA: Insufficient documentation

## 2021-10-02 DIAGNOSIS — R079 Chest pain, unspecified: Secondary | ICD-10-CM | POA: Diagnosis not present

## 2021-10-02 DIAGNOSIS — I209 Angina pectoris, unspecified: Secondary | ICD-10-CM | POA: Diagnosis not present

## 2021-10-02 DIAGNOSIS — I208 Other forms of angina pectoris: Secondary | ICD-10-CM

## 2021-10-02 DIAGNOSIS — R0602 Shortness of breath: Secondary | ICD-10-CM | POA: Diagnosis not present

## 2021-10-02 HISTORY — DX: Essential (primary) hypertension: I10

## 2021-10-02 LAB — CBC
HCT: 48.7 % (ref 39.0–52.0)
Hemoglobin: 16 g/dL (ref 13.0–17.0)
MCH: 31.1 pg (ref 26.0–34.0)
MCHC: 32.9 g/dL (ref 30.0–36.0)
MCV: 94.6 fL (ref 80.0–100.0)
Platelets: 204 10*3/uL (ref 150–400)
RBC: 5.15 MIL/uL (ref 4.22–5.81)
RDW: 14.3 % (ref 11.5–15.5)
WBC: 6 10*3/uL (ref 4.0–10.5)
nRBC: 0 % (ref 0.0–0.2)

## 2021-10-02 LAB — BASIC METABOLIC PANEL
Anion gap: 7 (ref 5–15)
BUN: 16 mg/dL (ref 6–20)
CO2: 25 mmol/L (ref 22–32)
Calcium: 8.9 mg/dL (ref 8.9–10.3)
Chloride: 104 mmol/L (ref 98–111)
Creatinine, Ser: 0.68 mg/dL (ref 0.61–1.24)
GFR, Estimated: >60 mL/min (ref >60)
Glucose, Bld: 97 mg/dL (ref 70–99)
Potassium: 4 mmol/L (ref 3.5–5.1)
Sodium: 136 mmol/L (ref 135–145)

## 2021-10-02 LAB — TROPONIN I (HIGH SENSITIVITY)
Troponin I (High Sensitivity): 4 ng/L (ref <18)
Troponin I (High Sensitivity): 5 ng/L (ref <18)

## 2021-10-12 ENCOUNTER — Ambulatory Visit (INDEPENDENT_AMBULATORY_CARE_PROVIDER_SITE_OTHER): Payer: BC Managed Care – PPO | Admitting: Nurse Practitioner

## 2021-10-12 ENCOUNTER — Encounter: Payer: Self-pay | Admitting: Nurse Practitioner

## 2021-10-12 VITALS — BP 148/90 | HR 90 | Ht 67.0 in | Wt 359.6 lb

## 2021-10-12 DIAGNOSIS — I10 Essential (primary) hypertension: Secondary | ICD-10-CM

## 2021-10-12 DIAGNOSIS — F17200 Nicotine dependence, unspecified, uncomplicated: Secondary | ICD-10-CM | POA: Diagnosis not present

## 2021-10-12 DIAGNOSIS — Z6841 Body Mass Index (BMI) 40.0 and over, adult: Secondary | ICD-10-CM | POA: Diagnosis not present

## 2021-10-12 MED ORDER — SPHYGMOMANOMETER MISC: 1.0000 | Freq: Every day | 0 refills | Status: AC

## 2021-10-12 NOTE — Assessment & Plan Note (Signed)
Patient smoke 1 pack of cigarettes a day. Smoking cessation was discussed, 5-7 minutes was spent of this topic specifically.

## 2021-10-12 NOTE — Assessment & Plan Note (Addendum)
BMI 56.32 in the office today. Advised pt to lose weight. Advised patient to avoid trans fat, fatty and fried food. Follow a regular physical activity schedule. Went over the risk of chronic diseases with increased weight.   Referral sent to nutritionist

## 2021-10-12 NOTE — Assessment & Plan Note (Signed)
Patient BP 148/90 in the office today Advised pt to follow a low sodium and heart healthy diet. Continue losartan/HCTZ 50-12 0.5 mg daily. Advised patient to check blood pressure at home and bring the readings to the next appointment. Nurse visit for blood pressure recheck on 12th June.

## 2021-10-12 NOTE — Progress Notes (Signed)
New Patient Office Visit  Subjective:  Patient ID: Walter Pierce, male    DOB: [birth year]  Age: 51 y.o. MRN: [id number]  CC:  Chief Complaint  Patient presents with   Hypertension   HPI: Patient is here for blood pressure follow-up.  Patient does not checks his blood pressure at home.  Patient states his take blood pressure medication daily.  He went to the ED on 02 October 2021 with complaint of chest pain.  The blood work and the chest x-ray done in the ED was within normal limits and the patient was discharged home.  Patient denies any chest pain or shortness of breath at present.  Past Medical History:  Diagnosis Date   Coronary artery disease    Hypertension      Current Outpatient Medications:    Blood Pressure Monitoring (SPHYGMOMANOMETER) MISC, 1 each by Does not apply route daily., Disp: 1 each, Rfl: 0   albuterol (VENTOLIN HFA) 108 (90 Base) MCG/ACT inhaler, Inhale 2 puffs into the lungs every 6 (six) hours as needed for wheezing or shortness of breath., Disp: 8 g, Rfl: 2   baclofen (LIORESAL) 10 MG tablet, Take 1 tablet (10 mg total) by mouth 3 (three) times daily as needed for muscle spasms (back pain)., Disp: 30 tablet, Rfl: 4   brompheniramine-pseudoephedrine-DM 30-2-10 MG/5ML syrup, Take 5 mLs by mouth 4 (four) times daily as needed., Disp: 120 mL, Rfl: 0   lidocaine (LIDODERM) 5 %, Place 1 patch onto the skin every 12 (twelve) hours. Remove & Discard patch within 12 hours or as directed by MD.  Leave the patch off for 12 hours before applying a new one., Disp: 10 patch, Rfl: 0   losartan-hydrochlorothiazide (HYZAAR) 50-12.5 MG tablet, Take 1 tablet by mouth daily., Disp: 90 tablet, Rfl: 1   tobramycin (TOBREX) 0.3 % ophthalmic solution, Place 2 drops into both eyes every 6 (six) hours., Disp: 5 mL, Rfl: 0   traMADol (ULTRAM) 50 MG tablet, Take 2 tablets (100 mg total) by mouth 2 (two) times daily., Disp: 60 tablet, Rfl: 1   Past Surgical History:  Procedure  Laterality Date   CORONARY ANGIOPLASTY     CORONARY ANGIOPLASTY WITH STENT PLACEMENT  2010   heart cath      Family History  Problem Relation Age of Onset   Diabetes Mother    Arthritis Mother    Hypertension Mother    Drug abuse Mother    Heart attack Father    Diabetes Maternal Grandmother    Diabetes Maternal Grandfather    Diabetes Paternal Grandmother    Hypertension Sister    Diabetes Daughter    Hypertension Sister     Social History   Socioeconomic History   Marital status: Single    Spouse name: Not on file   Number of children: Not on file   Years of education: Not on file   Highest education level: Not on file  Occupational History   Not on file  Tobacco Use   Smoking status: Every Day    Packs/day: 1.00    Types: Cigarettes   Smokeless tobacco: Never  Vaping Use   Vaping Use: Never used  Substance and Sexual Activity   Alcohol use: Yes    Comment: occ   Drug use: No   Sexual activity: Not on file  Other Topics Concern   Not on file  Social History Narrative   Not on file   Social Determinants of Health   Financial  Resource Strain: Not on file  Food Insecurity: Not on file  Transportation Needs: Not on file  Physical Activity: Not on file  Stress: Not on file  Social Connections: Not on file  Intimate Partner Violence: Not on file    ROS Review of Systems  Constitutional: Negative.   HENT: Negative.    Eyes: Negative.   Respiratory: Negative.    Cardiovascular: Negative.   Gastrointestinal: Negative.   Endocrine: Negative.   Genitourinary: Negative.   Musculoskeletal: Negative.   Skin: Negative.   Allergic/Immunologic: Negative.   Neurological: Negative.   Hematological: Negative.   Psychiatric/Behavioral: Negative.    All other systems reviewed and are negative.   Objective:   Today's Vitals: BP (!) 148/90   Pulse 90   Ht 5\' 7"  (1.702 m)   Wt (!) 359 lb 9.6 oz (163.1 kg)   BMI 56.32 kg/m   Physical  Exam Constitutional:      Appearance: Normal appearance. He is obese.  HENT:     Head: Normocephalic.     Nose: Nose normal.     Mouth/Throat:     Mouth: Mucous membranes are moist.     Pharynx: Oropharynx is clear.  Eyes:     Extraocular Movements: Extraocular movements intact.     Conjunctiva/sclera: Conjunctivae normal.     Pupils: Pupils are equal, round, and reactive to light.  Cardiovascular:     Rate and Rhythm: Normal rate.     Pulses: Normal pulses.     Heart sounds: Normal heart sounds. No murmur heard. Pulmonary:     Effort: Pulmonary effort is normal.     Breath sounds: No wheezing or rales.  Abdominal:     General: Bowel sounds are normal.     Palpations: Abdomen is soft.  Musculoskeletal:        General: No swelling or deformity.     Cervical back: Normal range of motion.  Skin:    General: Skin is warm.  Neurological:     General: No focal deficit present.     Mental Status: He is alert.     Assessment & Plan:   Problem List Items Addressed This Visit       Cardiovascular and Mediastinum   Primary hypertension - Primary    Patient BP 148/90 in the office today Advised pt to follow a low sodium and heart healthy diet. Continue losartan/HCTZ 50-12 0.5 mg daily. Advised patient to check blood pressure at home and bring the readings to the next appointment. Nurse visit for blood pressure recheck on 12th June.          Other   Obesity with serious comorbidity    BMI 56.32 in the office today. Advised pt to lose weight. Advised patient to avoid trans fat, fatty and fried food. Follow a regular physical activity schedule. Went over the risk of chronic diseases with increased weight.   Referral sent to nutritionist         Relevant Orders   Amb ref to Medical Nutrition Therapy-MNT   Smoker    Patient smoke 1 pack of cigarettes a day. Smoking cessation was discussed, 5-7 minutes was spent of this topic specifically.        Outpatient  Encounter Medications as of 10/12/2021  Medication Sig   Blood Pressure Monitoring (SPHYGMOMANOMETER) MISC 1 each by Does not apply route daily.   albuterol (VENTOLIN HFA) 108 (90 Base) MCG/ACT inhaler Inhale 2 puffs into the lungs every 6 (six) hours as needed  for wheezing or shortness of breath.   baclofen (LIORESAL) 10 MG tablet Take 1 tablet (10 mg total) by mouth 3 (three) times daily as needed for muscle spasms (back pain).   brompheniramine-pseudoephedrine-DM 30-2-10 MG/5ML syrup Take 5 mLs by mouth 4 (four) times daily as needed.   lidocaine (LIDODERM) 5 % Place 1 patch onto the skin every 12 (twelve) hours. Remove & Discard patch within 12 hours or as directed by MD.  Wynelle Fanny the patch off for 12 hours before applying a new one.   losartan-hydrochlorothiazide (HYZAAR) 50-12.5 MG tablet Take 1 tablet by mouth daily.   tobramycin (TOBREX) 0.3 % ophthalmic solution Place 2 drops into both eyes every 6 (six) hours.   traMADol (ULTRAM) 50 MG tablet Take 2 tablets (100 mg total) by mouth 2 (two) times daily.   No facility-administered encounter medications on file as of 10/12/2021.    Follow-up: Return in about 1 month (around 11/12/2021) for BP.   Kara Dies, NP

## 2021-10-18 ENCOUNTER — Ambulatory Visit: Payer: BC Managed Care – PPO | Admitting: *Deleted

## 2021-10-18 VITALS — BP 129/83 | HR 88

## 2021-10-18 DIAGNOSIS — I10 Essential (primary) hypertension: Secondary | ICD-10-CM

## 2021-11-10 ENCOUNTER — Ambulatory Visit (INDEPENDENT_AMBULATORY_CARE_PROVIDER_SITE_OTHER): Payer: BC Managed Care – PPO | Admitting: Nurse Practitioner

## 2021-11-10 ENCOUNTER — Encounter: Payer: Self-pay | Admitting: Nurse Practitioner

## 2021-11-10 VITALS — BP 124/78 | HR 78 | Ht 67.0 in | Wt 349.1 lb

## 2021-11-10 DIAGNOSIS — I10 Essential (primary) hypertension: Secondary | ICD-10-CM | POA: Diagnosis not present

## 2021-11-10 DIAGNOSIS — F17200 Nicotine dependence, unspecified, uncomplicated: Secondary | ICD-10-CM | POA: Diagnosis not present

## 2021-11-10 NOTE — Progress Notes (Signed)
Established Patient Office Visit  Subjective:  Patient ID: Walter Pierce, male    DOB: 03-Oct-1970  Age: 51 y.o. MRN: [id number]  CC:  Chief Complaint  Patient presents with   Follow-up     HPI  Mauro Lorenzo Arscott presents for follow up on blood pressure. He denies  chest pain, shortness of breath, headaches. He has lost 10 lb since the last visit. He is trying to eat healthy and has stopped drinking soda. He smokes a pack of cigarette everyday.   HPI   Past Medical History:  Diagnosis Date   Coronary artery disease    Hypertension     Past Surgical History:  Procedure Laterality Date   CORONARY ANGIOPLASTY     CORONARY ANGIOPLASTY WITH STENT PLACEMENT  2010   heart cath      Family History  Problem Relation Age of Onset   Diabetes Mother    Arthritis Mother    Hypertension Mother    Drug abuse Mother    Heart attack Father    Diabetes Maternal Grandmother    Diabetes Maternal Grandfather    Diabetes Paternal Grandmother    Hypertension Sister    Diabetes Daughter    Hypertension Sister     Social History   Socioeconomic History   Marital status: Single    Spouse name: Not on file   Number of children: Not on file   Years of education: Not on file   Highest education level: Not on file  Occupational History   Not on file  Tobacco Use   Smoking status: Every Day    Packs/day: 1.00    Types: Cigarettes   Smokeless tobacco: Never  Vaping Use   Vaping Use: Never used  Substance and Sexual Activity   Alcohol use: Yes    Comment: occ   Drug use: No   Sexual activity: Not on file  Other Topics Concern   Not on file  Social History Narrative   Not on file   Social Determinants of Health   Financial Resource Strain: Not on file  Food Insecurity: Not on file  Transportation Needs: Not on file  Physical Activity: Not on file  Stress: Not on file  Social Connections: Not on file  Intimate Partner Violence: Not on file      Outpatient Medications Prior to Visit  Medication Sig Dispense Refill   albuterol (VENTOLIN HFA) 108 (90 Base) MCG/ACT inhaler Inhale 2 puffs into the lungs every 6 (six) hours as needed for wheezing or shortness of breath. 8 g 2   baclofen (LIORESAL) 10 MG tablet Take 1 tablet (10 mg total) by mouth 3 (three) times daily as needed for muscle spasms (back pain). 30 tablet 4   Blood Pressure Monitoring (SPHYGMOMANOMETER) MISC 1 each by Does not apply route daily. 1 each 0   brompheniramine-pseudoephedrine-DM 30-2-10 MG/5ML syrup Take 5 mLs by mouth 4 (four) times daily as needed. 120 mL 0   lidocaine (LIDODERM) 5 % Place 1 patch onto the skin every 12 (twelve) hours. Remove & Discard patch within 12 hours or as directed by MD.  Wynelle Fanny the patch off for 12 hours before applying a new one. 10 patch 0   losartan-hydrochlorothiazide (HYZAAR) 50-12.5 MG tablet Take 1 tablet by mouth daily. 90 tablet 1   tobramycin (TOBREX) 0.3 % ophthalmic solution Place 2 drops into both eyes every 6 (six) hours. 5 mL 0   traMADol (ULTRAM) 50 MG tablet Take 2 tablets (100 mg  total) by mouth 2 (two) times daily. 60 tablet 1   No facility-administered medications prior to visit.    No Known Allergies  ROS Review of Systems  Constitutional:  Negative for activity change and fatigue.  HENT:  Negative for congestion, postnasal drip and trouble swallowing.   Respiratory:  Negative for apnea, cough and wheezing.   Cardiovascular:  Negative for chest pain and palpitations.  Gastrointestinal:  Negative for abdominal distention, blood in stool and vomiting.  Genitourinary:  Negative for difficulty urinating, genital sores and urgency.  Musculoskeletal:  Negative for arthralgias, joint swelling and neck stiffness.  Skin:  Negative for color change and rash.  Neurological:  Negative for dizziness, facial asymmetry and headaches.  Psychiatric/Behavioral:  Negative for agitation, behavioral problems and confusion.        Objective:    Physical Exam Constitutional:      Appearance: Normal appearance. He is obese.  HENT:     Head: Normocephalic and atraumatic.     Right Ear: Tympanic membrane normal.     Left Ear: Tympanic membrane normal.     Nose: Nose normal.     Mouth/Throat:     Mouth: Mucous membranes are moist.     Pharynx: Oropharynx is clear.  Eyes:     Extraocular Movements: Extraocular movements intact.     Conjunctiva/sclera: Conjunctivae normal.     Pupils: Pupils are equal, round, and reactive to light.  Cardiovascular:     Rate and Rhythm: Normal rate and regular rhythm.     Pulses: Normal pulses.     Heart sounds: Normal heart sounds.  Pulmonary:     Effort: Pulmonary effort is normal.     Breath sounds: Normal breath sounds.  Abdominal:     General: Bowel sounds are normal.     Palpations: Abdomen is soft. There is no mass.     Tenderness: There is no abdominal tenderness.  Musculoskeletal:        General: Normal range of motion.  Skin:    General: Skin is warm.     Capillary Refill: Capillary refill takes less than 2 seconds.  Neurological:     General: No focal deficit present.     Mental Status: He is alert and oriented to person, place, and time. Mental status is at baseline.  Psychiatric:        Mood and Affect: Mood normal.        Behavior: Behavior normal.        Thought Content: Thought content normal.        Judgment: Judgment normal.     BP 124/78   Pulse 78   Ht 5\' 7"  (1.702 m)   Wt (!) 349 lb 1.6 oz (158.4 kg)   BMI 54.68 kg/m  Wt Readings from Last 3 Encounters:  11/10/21 (!) 349 lb 1.6 oz (158.4 kg)  10/12/21 (!) 359 lb 9.6 oz (163.1 kg)  10/02/21 (!) 363 lb 12.1 oz (165 kg)     Health Maintenance  Topic Date Due   COVID-19 Vaccine (1) Never done   TETANUS/TDAP  Never done   Zoster Vaccines- Shingrix (1 of 2) Never done   COLONOSCOPY (Pts 45-40yrs Insurance coverage will need to be confirmed)  Never done   INFLUENZA VACCINE   11/07/2021   Hepatitis C Screening  Completed   HIV Screening  Completed   HPV VACCINES  Aged Out    There are no preventive care reminders to display for this patient.  Lab Results  Component Value Date   TSH 1.130 12/06/2014   Lab Results  Component Value Date   WBC 6.0 10/02/2021   HGB 16.0 10/02/2021   HCT 48.7 10/02/2021   MCV 94.6 10/02/2021   PLT 204 10/02/2021   Lab Results  Component Value Date   NA 136 10/02/2021   K 4.0 10/02/2021   CO2 25 10/02/2021   GLUCOSE 97 10/02/2021   BUN 16 10/02/2021   CREATININE 0.68 10/02/2021   BILITOT 0.6 07/03/2021   ALKPHOS 73 07/03/2021   AST 23 07/03/2021   ALT 21 07/03/2021   PROT 7.1 07/03/2021   ALBUMIN 3.4 (L) 07/03/2021   CALCIUM 8.9 10/02/2021   ANIONGAP 7 10/02/2021   Lab Results  Component Value Date   CHOL 189 12/06/2014   No results found for: "HDL" No results found for: "LDLCALC" Lab Results  Component Value Date   TRIG 180 (H) 12/06/2014   No results found for: "CHOLHDL" Lab Results  Component Value Date   HGBA1C 5.3 12/06/2014      Assessment & Plan:   Problem List Items Addressed This Visit       Cardiovascular and Mediastinum   Primary hypertension - Primary    Patient BP 124/78 in the office today.  Encouraged patient  to consume low sodium and heart healthy diet. Continue losartan/HCTZ 50-12.5 mg daily. Labs ordered.       Relevant Orders   CBC with Differential/Platelet   COMPLETE METABOLIC PANEL WITH GFR   Lipid panel   TSH   Hemoglobin A1c     Other   Morbid obesity (HCC)    Body mass index is 54.68 kg/m. Patient has lost 10 pounds since last visit.   Encourage patient to continue eating healthy and balanced diet. A referral was sent for nutritional counseling during the previous visit. Patient was provided with the phone number to reach out for lifestyle center at [facility]      Smoker    5 to 7 minutes was spent on smoking cessation.        No orders of the  defined types were placed in this encounter.   Follow-up: Return in about 3 months (around 02/10/2022), or if symptoms worsen or fail to improve.    Kara Dies, NP

## 2021-11-10 NOTE — Assessment & Plan Note (Signed)
Patient BP 124/78 in the office today.  Encouraged patient  to consume low sodium and heart healthy diet. Continue losartan/HCTZ 50-12.5 mg daily. Labs ordered.

## 2021-11-10 NOTE — Assessment & Plan Note (Signed)
Body mass index is 54.68 kg/m. Patient has lost 10 pounds since last visit.   Encourage patient to continue eating healthy and balanced diet. A referral was sent for nutritional counseling during the previous visit. Patient was provided with the phone number to reach out for lifestyle center at [facility]

## 2021-11-10 NOTE — Assessment & Plan Note (Signed)
5 to 7 minutes was spent on smoking cessation.

## 2021-11-14 ENCOUNTER — Ambulatory Visit (INDEPENDENT_AMBULATORY_CARE_PROVIDER_SITE_OTHER): Payer: BC Managed Care – PPO

## 2021-11-14 DIAGNOSIS — Z0000 Encounter for general adult medical examination without abnormal findings: Secondary | ICD-10-CM | POA: Diagnosis not present

## 2021-11-14 DIAGNOSIS — I10 Essential (primary) hypertension: Secondary | ICD-10-CM | POA: Diagnosis not present

## 2021-11-15 LAB — COMPLETE METABOLIC PANEL WITH GFR
AG Ratio: 1.2 (calc) (ref 1.0–2.5)
ALT: 23 U/L (ref 9–46)
AST: 18 U/L (ref 10–35)
Albumin: 3.8 g/dL (ref 3.6–5.1)
Alkaline phosphatase (APISO): 76 U/L (ref 35–144)
BUN: 23 mg/dL (ref 7–25)
CO2: 24 mmol/L (ref 20–32)
Calcium: 9 mg/dL (ref 8.6–10.3)
Chloride: 102 mmol/L (ref 98–110)
Creat: 0.81 mg/dL (ref 0.70–1.30)
Globulin: 3.1 g/dL (calc) (ref 1.9–3.7)
Glucose, Bld: 89 mg/dL (ref 65–99)
Potassium: 4.3 mmol/L (ref 3.5–5.3)
Sodium: 136 mmol/L (ref 135–146)
Total Bilirubin: 0.4 mg/dL (ref 0.2–1.2)
Total Protein: 6.9 g/dL (ref 6.1–8.1)
eGFR: 107 mL/min/{1.73_m2} (ref >=60)

## 2021-11-15 LAB — CBC WITH DIFFERENTIAL/PLATELET
Absolute Monocytes: 469 cells/uL (ref 200–950)
Basophils Absolute: 40 cells/uL (ref 0–200)
Basophils Relative: 0.6 %
Eosinophils Absolute: 67 cells/uL (ref 15–500)
Eosinophils Relative: 1 %
HCT: 49 % (ref 38.5–50.0)
Hemoglobin: 16.7 g/dL (ref 13.2–17.1)
Lymphs Abs: 2479 cells/uL (ref 850–3900)
MCH: 31.7 pg (ref 27.0–33.0)
MCHC: 34.1 g/dL (ref 32.0–36.0)
MCV: 93.2 fL (ref 80.0–100.0)
MPV: 10.8 fL (ref 7.5–12.5)
Monocytes Relative: 7 %
Neutro Abs: 3645 cells/uL (ref 1500–7800)
Neutrophils Relative %: 54.4 %
Platelets: 202 10*3/uL (ref 140–400)
RBC: 5.26 10*6/uL (ref 4.20–5.80)
RDW: 14 % (ref 11.0–15.0)
Total Lymphocyte: 37 %
WBC: 6.7 10*3/uL (ref 3.8–10.8)

## 2021-11-15 LAB — TSH: TSH: 1.17 mIU/L (ref 0.40–4.50)

## 2021-11-15 LAB — LIPID PANEL
Cholesterol: 155 mg/dL (ref <200)
HDL: 35 mg/dL — ABNORMAL LOW (ref >=40)
LDL Cholesterol (Calc): 98 mg/dL (calc)
Non-HDL Cholesterol (Calc): 120 mg/dL (calc) (ref <130)
Total CHOL/HDL Ratio: 4.4 (calc) (ref <5.0)
Triglycerides: 122 mg/dL (ref <150)

## 2021-11-15 LAB — HEMOGLOBIN A1C
Hgb A1c MFr Bld: 5.5 % of total Hgb (ref <5.7)
Mean Plasma Glucose: 111 mg/dL
eAG (mmol/L): 6.2 mmol/L

## 2021-11-15 LAB — PSA: PSA: 0.32 ng/mL (ref <=4.00)

## 2021-12-20 ENCOUNTER — Emergency Department: Payer: BC Managed Care – PPO

## 2021-12-20 ENCOUNTER — Other Ambulatory Visit: Payer: Self-pay

## 2021-12-20 ENCOUNTER — Emergency Department
Admission: EM | Admit: 2021-12-20 | Discharge: 2021-12-20 | Disposition: A | Discharge status: Home / Self Care | Payer: BC Managed Care – PPO | Attending: Emergency Medicine | Admitting: Emergency Medicine

## 2021-12-20 DIAGNOSIS — R0789 Other chest pain: Secondary | ICD-10-CM | POA: Diagnosis not present

## 2021-12-20 DIAGNOSIS — R079 Chest pain, unspecified: Secondary | ICD-10-CM | POA: Diagnosis not present

## 2021-12-20 LAB — BASIC METABOLIC PANEL
Anion gap: 7 (ref 5–15)
BUN: 23 mg/dL — ABNORMAL HIGH (ref 6–20)
CO2: 26 mmol/L (ref 22–32)
Calcium: 8.8 mg/dL — ABNORMAL LOW (ref 8.9–10.3)
Chloride: 105 mmol/L (ref 98–111)
Creatinine, Ser: 0.92 mg/dL (ref 0.61–1.24)
GFR, Estimated: >60 mL/min (ref >60)
Glucose, Bld: 122 mg/dL — ABNORMAL HIGH (ref 70–99)
Potassium: 4.4 mmol/L (ref 3.5–5.1)
Sodium: 138 mmol/L (ref 135–145)

## 2021-12-20 LAB — TROPONIN I (HIGH SENSITIVITY)
Troponin I (High Sensitivity): 4 ng/L (ref <18)
Troponin I (High Sensitivity): 4 ng/L (ref <18)

## 2021-12-20 LAB — CBC
HCT: 49.3 % (ref 39.0–52.0)
Hemoglobin: 16.1 g/dL (ref 13.0–17.0)
MCH: 31 pg (ref 26.0–34.0)
MCHC: 32.7 g/dL (ref 30.0–36.0)
MCV: 95 fL (ref 80.0–100.0)
Platelets: 186 10*3/uL (ref 150–400)
RBC: 5.19 MIL/uL (ref 4.22–5.81)
RDW: 14.2 % (ref 11.5–15.5)
WBC: 6.7 10*3/uL (ref 4.0–10.5)
nRBC: 0 % (ref 0.0–0.2)

## 2021-12-20 MED ORDER — NAPROXEN 375 MG PO TABS: 375.0000 mg | ORAL_TABLET | Freq: Two times a day (BID) | ORAL | 0 refills | Status: AC

## 2021-12-20 NOTE — ED Triage Notes (Signed)
Pt here with cp that started this morning. Pt states pain is centered and radiates to his left arm. Pt denies N/V/D.

## 2021-12-20 NOTE — ED Provider Notes (Signed)
[facility] Provider Note    Event Date/Time   First MD Initiated Contact with Patient 12/20/21 1027     (approximate)   History   Chest Pain   HPI  Walter Pierce is a 51 y.o. male with history of hypertension, prior coronary disease, though he states he did not receive any stents, here with intermittent left-sided chest pain.  The patient states that over the last day, he has had intermittent, sharp, pulsating, transient, left-sided chest pain.  It radiated towards his left arm and he had some transient left arm tingling today in his hand.  He was at work when this happened so presents for evaluation.  Does do repetitive heavy lifting and movement with his arms at work.  Denies any shortness of breath currently.  The chest pain is now resolved.  When it would come on, it would come on fairly acutely and then resolved over several seconds.  It did not persist.  No diaphoresis.  No nausea or vomiting.     Physical Exam   Triage Vital Signs: ED Triage Vitals  Enc Vitals Group     BP 12/20/21 0929 (!) 109/94     Pulse Rate 12/20/21 0929 91     Resp 12/20/21 0929 20     Temp 12/20/21 0929 98.4 F (36.9 C)     Temp Source 12/20/21 0929 Oral     SpO2 12/20/21 0929 96 %     Weight 12/20/21 0927 (!) 349 lb 3.3 oz (158.4 kg)     Height 12/20/21 0927 5\' 7"  (1.702 m)     Head Circumference --      Peak Flow --      Pain Score 12/20/21 0927 8     Pain Loc --      Pain Edu? --      Excl. in GC? --     Most recent vital signs: Vitals:   12/20/21 1100 12/20/21 1300  BP: 114/62 120/70  Pulse: 76 (!) 53  Resp: (!) 26 (!) 24  Temp:  98.4 F (36.9 C)  SpO2: 98% 98%     General: Awake, no distress.  CV:  Good peripheral perfusion.  Resp:  Normal effort.  Lungs clear to auscultation bilaterally. Abd:  No distention.  Other:  Mild chest wall tenderness over the left parasternal lower medial chest.  No deformity.  No skin lesions.   ED Results  / Procedures / Treatments   Labs (all labs ordered are listed, but only abnormal results are displayed) Labs Reviewed  BASIC METABOLIC PANEL - Abnormal; Notable for the following components:      Result Value   Glucose, Bld 122 (*)    BUN 23 (*)    Calcium 8.8 (*)    All other components within normal limits  CBC  TROPONIN I (HIGH SENSITIVITY)  TROPONIN I (HIGH SENSITIVITY)     EKG Normal sinus rhythm, ventricular rate 90.  PR 134, QRS 86, QTc 435.  No acute ST elevations or depressions.  No EKG evidence of acute ischemia or infarct.   RADIOLOGY CXR: Clear   I also independently reviewed and agree with radiologist interpretations.   PROCEDURES:  Critical Care performed: No  .1-3 Lead EKG Interpretation  Performed by: 12/22/21, MD Authorized by: Shaune Pollack, MD     Interpretation: normal     ECG rate:  50-70s   ECG rate assessment: normal     Rhythm: sinus rhythm  Ectopy: none     Conduction: normal   Comments:     Indication: Chest pain     MEDICATIONS ORDERED IN ED: Medications - No data to display   IMPRESSION / MDM / ASSESSMENT AND PLAN / ED COURSE  I reviewed the triage vital signs and the nursing notes.                               acute presentation with potential threat to life or bodily function.The patient is on the cardiac monitor to evaluate for evidence of arrhythmia and/or significant heart rate changes.   Ddx:  Differential includes the following, with pertinent life- or limb-threatening emergencies considered:  Musculoskeletal chest pain/costochondritis, ACS, PE, GERD, gastritis, PNA, PTX, atelectasis  Patient's presentation is most consistent with acute presentation with potential threat to life or bodily function.  MDM:  51 yo M with no reported PMHx here with left sided CP. Pt reports h/o cath but was told "nothing about it," no known stent. Exam and history is more c/w costochondritis/MSK chest wall pain likely 2/2  pt's frequent lifting at work. CBC shows no leukocytosis. Electrolytes unremarkable. Trop neg x 2 with non-ischemic EKG, doubt ACS. Pt  has no LE swelling or signs of PE. Will tx for MSK chest pain and d/c with outpt follow-up. Will refer to outpt cards as pt does have h/o besity and ? H/o prior cath, though he states no stent placed.   MEDICATIONS GIVEN IN ED: Medications - No data to display   Consults:     EMR reviewed  Reviewed prior records, prior ED and PCP visits for HTN and angina, last seen by Kara Dies 11/2021     FINAL CLINICAL IMPRESSION(S) / ED DIAGNOSES   Final diagnoses:  Atypical chest pain     Rx / DC Orders   ED Discharge Orders          Ordered    naproxen (NAPROSYN) 375 MG tablet  2 times daily with meals        12/20/21 1404             Note:  This document was prepared using Dragon voice recognition software and may include unintentional dictation errors.   Shaune Pollack, MD 12/20/21 [phone]

## 2021-12-20 NOTE — Discharge Instructions (Addendum)
For now, take the naproxen as prescribed twice a day for the next week.  Try to minimize your frequent or repetitive lifting at work.  I recommend calling cardiology to set up an appointment within the next 1 to 2 weeks.  You may benefit from outpatient stress testing.

## 2021-12-20 NOTE — ED Notes (Signed)
This RN did not D/C pt, pt found to be gone from room.

## 2021-12-20 NOTE — ED Notes (Signed)
Pt discharged home with instructions reviewed.   

## 2022-03-09 ENCOUNTER — Other Ambulatory Visit: Payer: Self-pay | Admitting: Internal Medicine

## 2022-03-09 DIAGNOSIS — I10 Essential (primary) hypertension: Secondary | ICD-10-CM

## 2024-02-20 ENCOUNTER — Emergency Department (HOSPITAL_BASED_OUTPATIENT_CLINIC_OR_DEPARTMENT_OTHER): Payer: Self-pay

## 2024-02-20 ENCOUNTER — Encounter (HOSPITAL_BASED_OUTPATIENT_CLINIC_OR_DEPARTMENT_OTHER): Payer: Self-pay

## 2024-02-20 ENCOUNTER — Inpatient Hospital Stay: Admission: RE | Admit: 2024-02-20 | Payer: Self-pay | Source: Ambulatory Visit

## 2024-02-20 ENCOUNTER — Other Ambulatory Visit: Payer: Self-pay

## 2024-02-20 ENCOUNTER — Emergency Department (HOSPITAL_BASED_OUTPATIENT_CLINIC_OR_DEPARTMENT_OTHER)
Admission: EM | Admit: 2024-02-20 | Discharge: 2024-02-20 | Disposition: A | Discharge status: Home / Self Care | Payer: Self-pay

## 2024-02-20 DIAGNOSIS — I10 Essential (primary) hypertension: Secondary | ICD-10-CM | POA: Insufficient documentation

## 2024-02-20 DIAGNOSIS — R059 Cough, unspecified: Secondary | ICD-10-CM | POA: Insufficient documentation

## 2024-02-20 DIAGNOSIS — S3011XA Contusion of abdominal wall, initial encounter: Secondary | ICD-10-CM | POA: Insufficient documentation

## 2024-02-20 DIAGNOSIS — Z79899 Other long term (current) drug therapy: Secondary | ICD-10-CM | POA: Insufficient documentation

## 2024-02-20 DIAGNOSIS — R509 Fever, unspecified: Secondary | ICD-10-CM | POA: Insufficient documentation

## 2024-02-20 DIAGNOSIS — X58XXXA Exposure to other specified factors, initial encounter: Secondary | ICD-10-CM | POA: Insufficient documentation

## 2024-02-20 LAB — CBC WITH DIFFERENTIAL/PLATELET
Abs Immature Granulocytes: 0.02 K/uL (ref 0.00–0.07)
Basophils Absolute: 0 K/uL (ref 0.0–0.1)
Basophils Relative: 1 %
Eosinophils Absolute: 0.1 K/uL (ref 0.0–0.5)
Eosinophils Relative: 2 %
HCT: 44.5 % (ref 39.0–52.0)
Hemoglobin: 14.8 g/dL (ref 13.0–17.0)
Immature Granulocytes: 0 %
Lymphocytes Relative: 31 %
Lymphs Abs: 2.2 K/uL (ref 0.7–4.0)
MCH: 31.7 pg (ref 26.0–34.0)
MCHC: 33.3 g/dL (ref 30.0–36.0)
MCV: 95.3 fL (ref 80.0–100.0)
Monocytes Absolute: 0.5 K/uL (ref 0.1–1.0)
Monocytes Relative: 7 %
Neutro Abs: 4.2 K/uL (ref 1.7–7.7)
Neutrophils Relative %: 59 %
Platelets: 204 K/uL (ref 150–400)
RBC: 4.67 MIL/uL (ref 4.22–5.81)
RDW: 14.6 % (ref 11.5–15.5)
WBC: 7.1 K/uL (ref 4.0–10.5)
nRBC: 0 % (ref 0.0–0.2)

## 2024-02-20 LAB — TROPONIN T, HIGH SENSITIVITY: Troponin T High Sensitivity: <15 ng/L (ref 0–19)

## 2024-02-20 LAB — PROTIME-INR
INR: 1 (ref 0.8–1.2)
Prothrombin Time: 14.2 s (ref 11.4–15.2)

## 2024-02-20 LAB — COMPREHENSIVE METABOLIC PANEL WITH GFR
ALT: 17 U/L (ref 0–44)
AST: 26 U/L (ref 15–41)
Albumin: 3.7 g/dL (ref 3.5–5.0)
Alkaline Phosphatase: 79 U/L (ref 38–126)
Anion gap: 8 (ref 5–15)
BUN: 16 mg/dL (ref 6–20)
CO2: 27 mmol/L (ref 22–32)
Calcium: 8.4 mg/dL — ABNORMAL LOW (ref 8.9–10.3)
Chloride: 105 mmol/L (ref 98–111)
Creatinine, Ser: 0.71 mg/dL (ref 0.61–1.24)
GFR, Estimated: >60 mL/min (ref >60)
Glucose, Bld: 80 mg/dL (ref 70–99)
Potassium: 4.1 mmol/L (ref 3.5–5.1)
Sodium: 139 mmol/L (ref 135–145)
Total Bilirubin: 0.6 mg/dL (ref 0.0–1.2)
Total Protein: 6.7 g/dL (ref 6.5–8.1)

## 2024-02-20 LAB — APTT: aPTT: 29 s (ref 24–36)

## 2024-02-20 LAB — LIPASE, BLOOD: Lipase: 37 U/L (ref 11–51)

## 2024-02-20 MED ORDER — IOHEXOL 300 MG/ML  SOLN: 100.0000 mL | Freq: Once | INTRAMUSCULAR | Status: DC | PRN

## 2024-02-20 NOTE — ED Triage Notes (Signed)
 Pt reports coughing with  left mid back that radiates to right upper abdomin since last week. Noticed bruising to lower abdomen yesterday. Pain with coughing. Pain has decreased since last week. Denies SOB

## 2024-02-20 NOTE — ED Provider Notes (Addendum)
  EMERGENCY DEPARTMENT AT [facility] Provider Note   CSN: [id number] Arrival date & time: 02/20/24  9171     Patient presents with: Abdominal Pain and Bleeding/Bruising   Walter Pierce is a 53 y.o. male.  Patient is here for evaluation of new lower abdominal bruising and continued right upper quadrant pain.  Patient had a cold last week including, fever, productive cough with congestion.  Patient reports the fever was low-grade and only lasted about 2 days, responding well to over-the-counter Tylenol .  He continues to have a residual cough and when he coughs he has right upper quadrant pain that extends into his back.  He reports that this cough has improved since last week; though he continues to have pain with cough, this pain has also improved.  His main concern today is new lower abdominal bruising that first appeared yesterday.  He denies any trauma or heavy lifting.  There is no pain associated with this bruising.  He is not on thinners, does not take Tylenol  regularly, does not drink alcohol regularly.  The only over-the-counter medication that he takes regularly is Excedrin for headaches, which he takes daily.  He denies any other regular NSAID use.  He has never experienced bruising like this in the past.  He denies shortness of breath, chest pain, N/V/D, hematuria, dysuria, change in urinary frequency, blood in stool, melena, dizziness, or syncope.  Last bowel movement was earlier today which was normal.  He has a daily bowel movement in the mornings which is unchanged.  Patient denies recent change in medications.  He does note he is supposed to be taking hypertension medications which he has not for the past year due to being uninsured.  The history is provided by the patient.  Abdominal Pain Pain location:  RUQ Pain quality: sharp   Pain radiates to:  R shoulder and back Pain severity:  Mild Onset quality:  Gradual Duration:  1 week Timing:   Intermittent (With cough) Progression:  Improving Chronicity:  New Context: recent illness   Context: not alcohol use, not medication withdrawal, not retching and not trauma   Exacerbated by: cough. Associated symptoms: cough   Associated symptoms: no chest pain, no constipation, no diarrhea, no dysuria, no hematemesis, no hematochezia, no hematuria, no melena, no nausea, no shortness of breath and no vomiting   Risk factors: NSAID use and obesity   Risk factors: no alcohol abuse, no aspirin use and not pregnant       Prior to Admission medications   Medication Sig Start Date End Date Taking? Authorizing Provider  albuterol  (VENTOLIN  HFA) 108 (90 Base) MCG/ACT inhaler Inhale 2 puffs into the lungs every 6 (six) hours as needed for wheezing or shortness of breath. 08/01/21   Lang Dover, MD  Blood Pressure Monitoring [facility]) MISC 1 each by Does not apply route daily. 10/12/21   Vincente Saber, NP  brompheniramine-pseudoephedrine-DM 30-2-10 MG/5ML syrup Take 5 mLs by mouth 4 (four) times daily as needed. 07/31/21   Saunders Shona CROME, PA-C  losartan -hydrochlorothiazide (HYZAAR) 50-12.5 MG tablet TAKE 1 TABLET BY MOUTH DAILY 03/09/22   Britta King, MD  tobramycin  (TOBREX ) 0.3 % ophthalmic solution Place 2 drops into both eyes every 6 (six) hours. 07/31/21   Saunders Shona CROME, PA-C  traMADol  (ULTRAM ) 50 MG tablet Take 2 tablets (100 mg total) by mouth 2 (two) times daily. 09/12/21   Britta King, MD    Allergies: Iodinated contrast media    Review of Systems  Respiratory:  Positive for cough. Negative for shortness of breath.   Cardiovascular:  Negative for chest pain.  Gastrointestinal:  Positive for abdominal pain. Negative for constipation, diarrhea, hematemesis, hematochezia, melena, nausea and vomiting.  Genitourinary:  Negative for dysuria and hematuria.    Updated Vital Signs BP (!) 146/93   Pulse 70   Temp 98 F (36.7 C)   Resp (!) 24   Wt (!) 159.7 kg   BMI  55.13 kg/m   Physical Exam Vitals and nursing note reviewed.  Constitutional:      General: He is not in acute distress.    Appearance: He is well-developed. He is obese. He is not ill-appearing.  HENT:     Head: Normocephalic and atraumatic.  Eyes:     General: No scleral icterus.    Extraocular Movements: Extraocular movements intact.  Cardiovascular:     Rate and Rhythm: Normal rate and regular rhythm.     Heart sounds: Normal heart sounds.  Pulmonary:     Effort: Pulmonary effort is normal. No respiratory distress.     Breath sounds: Normal breath sounds. No stridor. No wheezing, rhonchi or rales.  Chest:     Chest wall: No tenderness.  Abdominal:     General: There is no distension.     Palpations: Abdomen is soft. There is no shifting dullness, fluid wave or mass.     Tenderness: There is no abdominal tenderness. There is no guarding or rebound. Negative signs include Murphy's sign, Rovsing's sign and McBurney's sign.     Comments: Large area of bruising above the umbilicus.  Pictured below.  This area is nontender to deep palpation.  Large body habitus.  Skin:    General: Skin is warm and dry.     Coloration: Skin is not jaundiced or pale.  Neurological:     Mental Status: He is alert and oriented to person, place, and time.     Media Information   Document Information  Photos    02/20/2024 09:25  Attached To:  Hospital Encounter on 02/20/24  Source Information  Prosperi, Sherlean DEL, PA-C  [facility]    (all labs ordered are listed, but only abnormal results are displayed) Labs Reviewed  COMPREHENSIVE METABOLIC PANEL WITH GFR - Abnormal; Notable for the following components:      Result Value   Calcium 8.4 (*)    All other components within normal limits  LIPASE, BLOOD  PROTIME-INR  CBC WITH DIFFERENTIAL/PLATELET  APTT  URINALYSIS, ROUTINE W REFLEX MICROSCOPIC  TROPONIN T, HIGH SENSITIVITY    EKG: EKG  Interpretation Date/Time:  Thursday February 20 2024 09:04:12 EST Ventricular Rate:  72 PR Interval:  149 QRS Duration:  104 QT Interval:  395 QTC Calculation: 433 R Axis:   -19  Text Interpretation: Sinus rhythm Borderline left axis deviation Compared with prior EKG from 12/20/2021 Confirmed by Gennaro Bouchard ([id number]) on 02/20/2024 9:08:32 AM  Radiology: CT ABDOMEN PELVIS WO CONTRAST Result Date: 02/20/2024 CLINICAL DATA:  Abdominal pain and bruising. EXAM: CT ABDOMEN AND PELVIS WITHOUT CONTRAST TECHNIQUE: Multidetector CT imaging of the abdomen and pelvis was performed following the standard protocol without IV contrast. RADIATION DOSE REDUCTION: This exam was performed according to the departmental dose-optimization program which includes automated exposure control, adjustment of the mA and/or kV according to patient size and/or use of iterative reconstruction technique. COMPARISON:  None Available. FINDINGS: Lower chest: Mosaic attenuation at the left-greater-than-right lung bases could reflect an inflammatory/infectious process or air trapping. No  dense focal consolidation. No pleural effusion. Hepatobiliary: No suspicious focal hepatic lesion identified within the limits of an unenhanced exam. Gallbladder is unremarkable. No biliary dilatation. Pancreas: Unremarkable. No pancreatic ductal dilatation or surrounding inflammatory changes. Spleen: Normal in size without focal abnormality. Adrenals/Urinary Tract: Adrenal glands are unremarkable. No urolithiasis or hydronephrosis. Bladder is unremarkable. Stomach/Bowel: Stomach is within normal limits. Appendix appears normal. No evidence of bowel wall thickening, distention, or inflammatory changes. Vascular/Lymphatic: Abdominal aorta is normal in caliber. No enlarged abdominal or pelvic lymph nodes. Reproductive: Prostate is unremarkable. Other: No intraperitoneal free air. No abdominopelvic ascites. Small fat containing umbilical hernia.  Musculoskeletal: Asymmetric fullness and hyperdensity of the right rectus abdominis muscle the level of the mid abdomen (series 2, image 45 and series 7, image 83), favored to reflect intramuscular hematoma. There is surrounding subcutaneous edema/stranding along the right ventral abdominal wall and right flank. No loculated subcutaneous fluid collection. No acute osseous abnormality. No suspicious osseous lesions. Degenerative disc height loss and desiccation with endplate osteophytosis at L5-S1. Mild degenerative changes of the bilateral hips. IMPRESSION: 1. Asymmetric fullness and increased density of the right rectus abdominis muscle the level of the mid abdomen, favored to reflect intramuscular hematoma. There is surrounding subcutaneous edema/stranding along the right ventral abdominal wall and right flank. 2. No evidence of acute injury involving the viscera of the abdomen or pelvis. 3. Mosaic attenuation at the left-greater-than-right lung bases could reflect an inflammatory/infectious process or air trapping. Electronically Signed   By: Harrietta Sherry M.D.   On: 02/20/2024 12:16     Procedures   Medications Ordered in the ED  iohexol (OMNIPAQUE) 300 MG/ML solution 100 mL ( Intravenous Canceled Entry 02/20/24 1112)      Patient presents to the ED for concern of new bruising of the abdomen, this involves an extensive number of treatment options, and is a complaint that carries with it a high risk of complications and morbidity.  The differential diagnosis includes hemorrhagic pancreatitis, hepatitis, clotting disorder, malignancy of the blood, chronic liver disease, trauma, thrombocytopenia, ruptured AAA, aortic dissection, or hematoma.   Co morbidities that complicate the patient evaluation  Unmanaged HTN Obesity   Additional history obtained:  Additional history obtained from  EMR   External records from outside source obtained and reviewed including medication history.   Lab  Tests:  I Ordered, and personally interpreted labs.  The pertinent results include: Unremarkable CBC with differential, CMP, and coagulation studies.   Imaging Studies ordered:  I ordered imaging studies including CT abdomen pelvis noncontrasted due to contrast allergy. I independently visualized and interpreted imaging which showed no evidence of acute injury and findings favorable for intramuscular hematoma. I agree with the radiologist interpretation.   Cardiac Monitoring:  EKG shows normal sinus rhythm with no evidence of STEMI.   Problem List / ED Course:  Patient is here for evaluation of new abdominal bruising.  Patient is afebrile and hemodynamically stable.  CBC, CMP, and coagulation studies were largely unremarkable so CT abdomen and pelvis ordered.  Imaging results consistent with intramuscular hematoma. Right upper quadrant pain with cough in the setting of recent URI.   Reevaluation:  After the interventions noted above, I reevaluated the patient and found that they have :stable   Social Determinants of Health:  Uninsured   Dispostion:  After consideration of the diagnostic results and the patients response to treatment, I feel that the patent would benefit from continued monitoring of bruising in the home setting.  He will return  if bruising worsens, spreads, new bruising develops, or any other concerning symptoms develop. Otherwise this hematoma and bruising should resolve on their own with time.                               Medical Decision Making Amount and/or Complexity of Data Reviewed Labs: ordered. Radiology: ordered. Decision-making details documented in ED Course.  Risk Prescription drug management.    Final diagnoses:  None    ED Discharge Orders     None      Clinical Course as of 02/21/24 0921  Thu Feb 20, 2024  1159 CT ABDOMEN PELVIS WO CONTRAST [EA]    Clinical Course User Index [EA] Rosina Almarie LABOR, PA-C        Rosina Almarie LABOR, PA-C 02/20/24 1235    Kammerer, Megan L, DO 02/21/24 0823    Rosina Almarie LABOR, PA-C 02/21/24 9078    Gennaro Duwaine CROME, DO 02/25/24 1830

## 2024-02-20 NOTE — Discharge Instructions (Signed)
 It was a pleasure meeting with you today.  As we discussed your lab work was normal and imaging shows an intramuscular hematoma that explains current abdominal bruising.  This was likely caused by your recent coughing.  This bruising should go away on its own without issue.  Please return if bruising worsens or spreads, becomes tender, more bruising develops, or any other concerning symptoms develop.  Fue un placer reunirme con usted hoy. Como comentamos, sus anlisis de laboratorio fueron normales y las imgenes muestran un hematoma intramuscular que explica el hematoma abdominal actual. Es probable que se deba a la tos reciente. Este hematoma debera desaparecer por s solo sin mayor problema.  Por favor, regrese si el hematoma empeora o se extiende, si se vuelve doloroso, si aparecen ms hematomas o si presenta cualquier otro sntoma preocupante.
